# Patient Record
Sex: Male | Born: 1995 | Race: White | Hispanic: No | Marital: Single | Smoking: Former smoker
Health system: Southern US, Community
[De-identification: ages and names within clinical notes are randomized; demographics above are authoritative.]

## PROBLEM LIST (undated history)

## (undated) DIAGNOSIS — L309 Dermatitis, unspecified: Secondary | ICD-10-CM

## (undated) DIAGNOSIS — S02609A Fracture of mandible, unspecified, initial encounter for closed fracture: Secondary | ICD-10-CM

## (undated) DIAGNOSIS — J45909 Unspecified asthma, uncomplicated: Secondary | ICD-10-CM

## (undated) DIAGNOSIS — M2652 Limited mandibular range of motion: Secondary | ICD-10-CM

## (undated) DIAGNOSIS — Z8709 Personal history of other diseases of the respiratory system: Secondary | ICD-10-CM

---
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
## 2015-01-12 ENCOUNTER — Emergency Department (HOSPITAL_COMMUNITY): Payer: 59

## 2015-01-12 ENCOUNTER — Encounter (HOSPITAL_COMMUNITY): Payer: Self-pay | Admitting: Emergency Medicine

## 2015-01-12 ENCOUNTER — Emergency Department (HOSPITAL_COMMUNITY)
Admission: EM | Admit: 2015-01-12 | Discharge: 2015-01-12 | Disposition: A | Discharge status: Home / Self Care | Payer: 59 | Attending: Emergency Medicine | Admitting: Emergency Medicine

## 2015-01-12 DIAGNOSIS — Z792 Long term (current) use of antibiotics: Secondary | ICD-10-CM | POA: Insufficient documentation

## 2015-01-12 DIAGNOSIS — R202 Paresthesia of skin: Secondary | ICD-10-CM | POA: Insufficient documentation

## 2015-01-12 DIAGNOSIS — Z8781 Personal history of (healed) traumatic fracture: Secondary | ICD-10-CM | POA: Insufficient documentation

## 2015-01-12 DIAGNOSIS — Z880 Allergy status to penicillin: Secondary | ICD-10-CM | POA: Insufficient documentation

## 2015-01-12 DIAGNOSIS — Z791 Long term (current) use of non-steroidal anti-inflammatories (NSAID): Secondary | ICD-10-CM | POA: Insufficient documentation

## 2015-01-12 DIAGNOSIS — R200 Anesthesia of skin: Secondary | ICD-10-CM

## 2015-01-12 DIAGNOSIS — J45909 Unspecified asthma, uncomplicated: Secondary | ICD-10-CM | POA: Insufficient documentation

## 2015-01-12 DIAGNOSIS — Z720 Tobacco use: Secondary | ICD-10-CM | POA: Insufficient documentation

## 2015-01-12 DIAGNOSIS — Z872 Personal history of diseases of the skin and subcutaneous tissue: Secondary | ICD-10-CM | POA: Insufficient documentation

## 2015-01-12 LAB — BASIC METABOLIC PANEL
Anion gap: 5 (ref 5–15)
BUN: 10 mg/dL (ref 6–23)
CALCIUM: 8.5 mg/dL (ref 8.4–10.5)
CO2: 29 mmol/L (ref 19–32)
CREATININE: 0.87 mg/dL (ref 0.50–1.35)
Chloride: 105 mmol/L (ref 96–112)
GFR calc Af Amer: >90 mL/min (ref >90)
GFR calc non Af Amer: >90 mL/min (ref >90)
GLUCOSE: 95 mg/dL (ref 70–99)
Potassium: 4.1 mmol/L (ref 3.5–5.1)
SODIUM: 139 mmol/L (ref 135–145)

## 2015-01-12 NOTE — ED Notes (Signed)
Pt alert & oriented x4, stable gait. Patient given discharge instructions, paperwork & prescription(s). Patient  instructed to stop at the registration desk to finish any additional paperwork. Patient verbalized understanding. Pt left department w/ no further questions. 

## 2015-01-12 NOTE — ED Notes (Signed)
Pt c/o left sided face numbness, left arm numbness and left leg numbness. Pt states this has been going on for a while.

## 2015-01-12 NOTE — Discharge Instructions (Signed)

## 2015-01-12 NOTE — ED Provider Notes (Signed)
CSN: [id number]     Arrival date & time 01/12/15  0119 History   First MD Initiated Contact with Patient 01/12/15 0145     Chief Complaint  Patient presents with  . Numbness     (Consider location/radiation/quality/duration/timing/severity/associated sxs/prior Treatment) HPI  Patient presents with left facial, left arm, and left leg numbness. Patient reports he has had left arm and leg numbness intermittently over the last several months. He reports he was evaluated previously in our ER for this and "told nothing was wrong." Patient has to since developed left facial numbness that has been more persistent. He is concerned and "wants to know what's going on." Denies any weakness, vision changes, headaches. Nothing seems to the symptoms better or worse.  Past Medical History  Diagnosis Date  . Eczema     back, legs  . History of asthma     as a child  . Mandible fracture 08/31/2013  . Limited jaw range of motion since 08/31/2013    due to mandible fracture  . Asthma    Past Surgical History  Procedure Laterality Date  . Orif mandibular fracture N/A 08/31/2013    Procedure: OPEN REDUCTION INTERNAL FIXATION (ORIF) MANDIBULAR FRACTURE MMF SCREWS ;  Surgeon: Darletta Moll, MD;  Location: [facility] OR;  Service: ENT;  Laterality: N/A;  . Mandibular hardware removal N/A 10/06/2013    Procedure: MMF REMOVAL;  Surgeon: Darletta Moll, MD;  Location: [facility];  Service: ENT;  Laterality: N/A;   Family History  Problem Relation Age of Onset  . Hypertension Mother    History  Substance Use Topics  . Smoking status: Current Some Day Smoker    Types: Cigarettes    Last Attempt to Quit: 06/22/2014  . Smokeless tobacco: Current User    Types: Snuff     Comment: inside smokers at home  . Alcohol Use: Yes    Review of Systems  Constitutional: Negative.  Negative for fever.  Respiratory: Negative.  Negative for chest tightness and shortness of breath.   Cardiovascular: Negative.   Negative for chest pain.  Gastrointestinal: Negative.  Negative for abdominal pain.  Genitourinary: Negative.  Negative for dysuria.  Musculoskeletal: Negative for back pain.  Skin: Negative for rash.  Neurological: Positive for numbness. Negative for tremors, weakness and headaches.  All other systems reviewed and are negative.     Allergies  Bactrim; Hydrocodone; and Penicillins  Home Medications   Prior to Admission medications   Medication Sig Start Date End Date Taking? Authorizing Provider  azithromycin (ZITHROMAX) 250 MG tablet Take 250 mg by mouth See admin instructions. Take first 2 tablets together, then 1 every day until finished. Completed course of medication today (05-20-14) 05/14/14   Tammy Triplett, PA-C  azithromycin (ZITHROMAX) 250 MG tablet Take 1 tablet (250 mg total) by mouth daily. Take first 2 tablets together, then 1 every day until finished. 06/25/14   Burgess Amor, PA-C  cetirizine-pseudoephedrine (ZYRTEC-D) 5-120 MG per tablet Take 1 tablet by mouth 2 (two) times daily. 06/25/14   Burgess Amor, PA-C  ibuprofen (ADVIL,MOTRIN) 600 MG tablet Take 1 tablet (600 mg total) by mouth every 6 (six) hours as needed. 06/25/14   Burgess Amor, PA-C  ibuprofen (ADVIL,MOTRIN) 800 MG tablet Take 1 tablet (800 mg total) by mouth 3 (three) times daily. 05/21/14   Tomasita Crumble, MD   BP 127/68 mmHg  Pulse 78  Temp(Src) 98 F (36.7 C) (Oral)  Resp 18  Ht 5' 11.5" (1.816 m)  Wt 182 lb (82.555 kg)  BMI 25.03 kg/m2  SpO2 99% Physical Exam  Constitutional: He is oriented to person, place, and time. He appears well-developed and well-nourished.  HENT:  Head: Normocephalic and atraumatic.  Eyes: EOM are normal. Pupils are equal, round, and reactive to light.  Cardiovascular: Normal rate, regular rhythm and normal heart sounds.   No murmur heard. Pulmonary/Chest: Effort normal and breath sounds normal. No respiratory distress. He has no wheezes.  Abdominal: Soft. Bowel sounds are normal.  There is no tenderness. There is no rebound.  Musculoskeletal: He exhibits no edema.  Neurological: He is alert and oriented to person, place, and time.  Cranial nerves II through XII intact, no dysmetria to finger-nose-finger, 5 out of 5 strength in all 4 extremities, subjective asymmetry to light touch to bilateral hands and each side of the face  Skin: Skin is warm and dry.  Psychiatric: He has a normal mood and affect.  Nursing note and vitals reviewed.   ED Course  Procedures (including critical care time) Labs Review Labs Reviewed  BASIC METABOLIC PANEL    Imaging Review Ct Head Wo Contrast  01/12/2015   CLINICAL DATA:  Facial numbness.  Left arm and leg numbness.  EXAM: CT HEAD WITHOUT CONTRAST  TECHNIQUE: Contiguous axial images were obtained from the base of the skull through the vertex without intravenous contrast.  COMPARISON:  03/04/2014  FINDINGS: Maintained gray-white differentiation. No CT evidence of an acute infarction. No intraparenchymal hemorrhage, mass, mass effect, or abnormal extra-axial fluid collection. The ventricles, cisterns, sulci are normal in size, shape, and position. The visualized paranasal sinuses and mastoid air cells are predominantly clear.  IMPRESSION: Normal noncontrast head CT.   Electronically Signed   By: Jearld LeschAndrew  DelGaizo M.D.   On: 01/12/2015 06:03     EKG Interpretation None      MDM   Final diagnoses:  Numbness   Patient presents with numbness and paresthesias. This is been ongoing and intermittent that recently developed facial numbness. Nontoxic on exam. No objective focal abnormalities. Basic labwork obtained to evaluate for electrolyte disturbance. Given patient's concern, CT scan of the head was obtained to evaluate for intracranial abnormality. CT negative. Basic labwork reassuring. Discussed with patient neurology follow-up for further evaluation if symptoms persist.  After history, exam, and medical workup I feel the patient has  been appropriately medically screened and is safe for discharge home. Pertinent diagnoses were discussed with the patient. Patient was given return precautions.   Shon Batonourtney F Horton, MD 01/13/15 [phone]

## 2015-01-12 NOTE — ED Notes (Signed)
Pt states he has had left arm & leg numbness for months. Pt says the face numbness started on Sunday.

## 2015-04-12 ENCOUNTER — Emergency Department (HOSPITAL_COMMUNITY)
Admission: EM | Admit: 2015-04-12 | Discharge: 2015-04-12 | Disposition: A | Discharge status: Home / Self Care | Payer: 59 | Attending: Emergency Medicine | Admitting: Emergency Medicine

## 2015-04-12 ENCOUNTER — Encounter (HOSPITAL_COMMUNITY): Payer: Self-pay | Admitting: Emergency Medicine

## 2015-04-12 DIAGNOSIS — J45909 Unspecified asthma, uncomplicated: Secondary | ICD-10-CM | POA: Insufficient documentation

## 2015-04-12 DIAGNOSIS — R509 Fever, unspecified: Secondary | ICD-10-CM | POA: Insufficient documentation

## 2015-04-12 DIAGNOSIS — Z880 Allergy status to penicillin: Secondary | ICD-10-CM | POA: Insufficient documentation

## 2015-04-12 DIAGNOSIS — Z720 Tobacco use: Secondary | ICD-10-CM | POA: Insufficient documentation

## 2015-04-12 DIAGNOSIS — Z872 Personal history of diseases of the skin and subcutaneous tissue: Secondary | ICD-10-CM | POA: Insufficient documentation

## 2015-04-12 DIAGNOSIS — R112 Nausea with vomiting, unspecified: Secondary | ICD-10-CM

## 2015-04-12 DIAGNOSIS — R197 Diarrhea, unspecified: Secondary | ICD-10-CM | POA: Insufficient documentation

## 2015-04-12 DIAGNOSIS — Z8739 Personal history of other diseases of the musculoskeletal system and connective tissue: Secondary | ICD-10-CM | POA: Insufficient documentation

## 2015-04-12 DIAGNOSIS — Z8781 Personal history of (healed) traumatic fracture: Secondary | ICD-10-CM | POA: Insufficient documentation

## 2015-04-12 MED ORDER — ONDANSETRON HCL 4 MG PO TABS: 4.0000 mg | ORAL_TABLET | Freq: Four times a day (QID) | ORAL | Status: DC

## 2015-04-12 MED ORDER — ONDANSETRON 8 MG PO TBDP
8.0000 mg | ORAL_TABLET | Freq: Once | ORAL | Status: AC
  Administered 2015-04-12: 8 mg via ORAL
  Filled 2015-04-12: qty 1

## 2015-04-12 MED ORDER — DIPHENOXYLATE-ATROPINE 2.5-0.025 MG PO TABS: 2.0000 | ORAL_TABLET | Freq: Four times a day (QID) | ORAL | Status: DC | PRN

## 2015-04-12 NOTE — Discharge Instructions (Signed)

## 2015-04-12 NOTE — ED Provider Notes (Signed)
CSN: [id number]     Arrival date & time 04/12/15  2116 History  This chart was scribed for Gilda Crease, MD by Phillis Haggis, ED Scribe. This patient was seen in room APA04/APA04 and patient care was started at 9:52 PM.     Chief Complaint  Patient presents with  . Fever  . Emesis   Patient is a 19 y.o. male presenting with vomiting. The history is provided by the patient. No language interpreter was used.  Emesis   HPI Comments: Randy [name] is a 19 y.o. male who presents to the Emergency Department complaining of fever, nausea, emesis, and diarrhea onset one day ago. States that Randy [name] is now able to hold down food and drinking fluids. Pt denies cough, sore throat, abdominal pain, or headache.   Past Medical History  Diagnosis Date  . Eczema     back, legs  . History of asthma     as a child  . Mandible fracture 08/31/2013  . Limited jaw range of motion since 08/31/2013    due to mandible fracture  . Asthma    Past Surgical History  Procedure Laterality Date  . Orif mandibular fracture N/A 08/31/2013    Procedure: OPEN REDUCTION INTERNAL FIXATION (ORIF) MANDIBULAR FRACTURE MMF SCREWS ;  Surgeon: Darletta Moll, MD;  Location: [facility] OR;  Service: ENT;  Laterality: N/A;  . Mandibular hardware removal N/A 10/06/2013    Procedure: MMF REMOVAL;  Surgeon: Darletta Moll, MD;  Location: [facility];  Service: ENT;  Laterality: N/A;   Family History  Problem Relation Age of Onset  . Hypertension Mother    History  Substance Use Topics  . Smoking status: Current Some Day Smoker    Types: Cigarettes    Last Attempt to Quit: 06/22/2014  . Smokeless tobacco: Current User    Types: Snuff     Comment: inside smokers at home  . Alcohol Use: Yes     Comment: Occassionally    Review of Systems  Gastrointestinal: Positive for vomiting.      Allergies  Bactrim; Hydrocodone; and Penicillins  Home Medications   Prior to Admission medications   Not on File   BP  150/69 mmHg  Pulse 72  Temp(Src) 99 F (37.2 C) (Oral)  Resp 18  Ht 5' 11.5" (1.816 m)  Wt 183 lb (83.008 kg)  BMI 25.17 kg/m2  SpO2 99%  Physical Exam  Constitutional: Randy [name] is oriented to person, place, and time. Randy [name] appears well-developed and well-nourished. No distress.  HENT:  Head: Normocephalic and atraumatic.  Right Ear: Hearing normal.  Left Ear: Hearing normal.  Nose: Nose normal.  Mouth/Throat: Oropharynx is clear and moist and mucous membranes are normal.  Eyes: Conjunctivae and EOM are normal. Pupils are equal, round, and reactive to light.  Neck: Normal range of motion. Neck supple.  Cardiovascular: Regular rhythm, S1 normal and S2 normal.  Exam reveals no gallop and no friction rub.   No murmur heard. Pulmonary/Chest: Effort normal and breath sounds normal. No respiratory distress. Randy [name] exhibits no tenderness.  Abdominal: Soft. Normal appearance and bowel sounds are normal. There is no hepatosplenomegaly. There is no tenderness. There is no rebound, no guarding, no tenderness at McBurney's point and negative Murphy's sign. No hernia.  Musculoskeletal: Normal range of motion.  Neurological: Randy [name] is alert and oriented to person, place, and time. Randy [name] has normal strength. No cranial nerve deficit or sensory deficit. Coordination normal. GCS eye subscore is 4. GCS  verbal subscore is 5. GCS motor subscore is 6.  Skin: Skin is warm, dry and intact. No rash noted. No cyanosis.  Psychiatric: Randy [name] has a normal mood and affect. His speech is normal and behavior is normal. Thought content normal.  Nursing note and vitals reviewed.   ED Course  Procedures (including critical care time) DIAGNOSTIC STUDIES: Oxygen Saturation is 99% on RA, normal by my interpretation.    COORDINATION OF CARE: 9:54 PM-Discussed treatment plan which includes work note, fluids and rest with pt at bedside and pt agreed to plan.     Labs Review Labs Reviewed  LIPASE, BLOOD  COMPREHENSIVE METABOLIC PANEL   CBC  URINALYSIS, ROUTINE W REFLEX MICROSCOPIC (NOT AT [facility])    Imaging Review No results found.   EKG Interpretation None      MDM   Final diagnoses:  None   vomiting  Diarrhea  Viral syndrome  Patient appears well. Randy [name] has symptoms of fever, nausea, vomiting, diarrhea with abdominal pain through the day. Randy [name] reports that his symptoms are improved now. [name] difficulty. Abdominal exam is entirely benign and nontender. Randy [name] has not experienced any pain associated with the symptoms. Combination of vomiting and diarrhea with low-grade fever consistent with a viral process. No concern for intra-abdominal process such as appendicitis, cholecystitis, etc. Treat symptomatically. Oral hydration at home.  I personally performed the services described in this documentation, which was scribed in my presence. The recorded information has been reviewed and is accurate.       Gilda Creasehristopher J Adisa Vigeant, MD 04/12/15 2203

## 2015-04-12 NOTE — ED Notes (Signed)
Pt states he started having N/V/D yesterday around lunch time

## 2015-05-17 ENCOUNTER — Emergency Department (HOSPITAL_COMMUNITY): Payer: 59

## 2015-05-17 ENCOUNTER — Encounter (HOSPITAL_COMMUNITY): Payer: Self-pay | Admitting: *Deleted

## 2015-05-17 ENCOUNTER — Emergency Department (HOSPITAL_COMMUNITY)
Admission: EM | Admit: 2015-05-17 | Discharge: 2015-05-17 | Disposition: A | Discharge status: Home / Self Care | Payer: 59 | Attending: Emergency Medicine | Admitting: Emergency Medicine

## 2015-05-17 DIAGNOSIS — Z8781 Personal history of (healed) traumatic fracture: Secondary | ICD-10-CM | POA: Insufficient documentation

## 2015-05-17 DIAGNOSIS — X58XXXA Exposure to other specified factors, initial encounter: Secondary | ICD-10-CM | POA: Insufficient documentation

## 2015-05-17 DIAGNOSIS — Y9389 Activity, other specified: Secondary | ICD-10-CM | POA: Insufficient documentation

## 2015-05-17 DIAGNOSIS — Y990 Civilian activity done for income or pay: Secondary | ICD-10-CM | POA: Insufficient documentation

## 2015-05-17 DIAGNOSIS — Z8739 Personal history of other diseases of the musculoskeletal system and connective tissue: Secondary | ICD-10-CM | POA: Insufficient documentation

## 2015-05-17 DIAGNOSIS — Z720 Tobacco use: Secondary | ICD-10-CM | POA: Insufficient documentation

## 2015-05-17 DIAGNOSIS — S39012A Strain of muscle, fascia and tendon of lower back, initial encounter: Secondary | ICD-10-CM | POA: Insufficient documentation

## 2015-05-17 DIAGNOSIS — Y9289 Other specified places as the place of occurrence of the external cause: Secondary | ICD-10-CM | POA: Insufficient documentation

## 2015-05-17 DIAGNOSIS — Z880 Allergy status to penicillin: Secondary | ICD-10-CM | POA: Insufficient documentation

## 2015-05-17 DIAGNOSIS — Z872 Personal history of diseases of the skin and subcutaneous tissue: Secondary | ICD-10-CM | POA: Insufficient documentation

## 2015-05-17 DIAGNOSIS — J45909 Unspecified asthma, uncomplicated: Secondary | ICD-10-CM | POA: Insufficient documentation

## 2015-05-17 MED ORDER — CYCLOBENZAPRINE HCL 10 MG PO TABS
10.0000 mg | ORAL_TABLET | Freq: Once | ORAL | Status: AC
  Administered 2015-05-17: 10 mg via ORAL
  Filled 2015-05-17: qty 1

## 2015-05-17 MED ORDER — NAPROXEN 250 MG PO TABS
500.0000 mg | ORAL_TABLET | Freq: Once | ORAL | Status: AC
  Administered 2015-05-17: 500 mg via ORAL
  Filled 2015-05-17: qty 2

## 2015-05-17 MED ORDER — CYCLOBENZAPRINE HCL 5 MG PO TABS: 5.0000 mg | ORAL_TABLET | Freq: Three times a day (TID) | ORAL | Status: DC | PRN

## 2015-05-17 MED ORDER — IBUPROFEN 600 MG PO TABS: 600.0000 mg | ORAL_TABLET | Freq: Four times a day (QID) | ORAL | Status: DC | PRN

## 2015-05-17 NOTE — Discharge Instructions (Signed)

## 2015-05-17 NOTE — ED Notes (Signed)
Pt c/o lower back pain after an injury at work today; pt states he was lifting heavy equipment when he was injured

## 2015-05-19 NOTE — ED Provider Notes (Signed)
CSN: [id number]     Arrival date & time 05/17/15  2044 History   First MD Initiated Contact with Patient 05/17/15 2102     Chief Complaint  Patient presents with  . Back Pain     (Consider location/radiation/quality/duration/timing/severity/associated sxs/prior Treatment) Patient is a 19 y.o. male presenting with back pain. The history is provided by the patient.  Back Pain Location:  Lumbar spine Quality:  Cramping and aching Radiates to:  Does not radiate Pain severity:  Moderate Pain is:  Same all the time Onset quality:  Sudden Duration:  8 hours Timing:  Constant Progression:  Unchanged Chronicity:  New Context: lifting heavy objects   Context comment:  Sx started when lifting a heavy piece of equipment at work today Relieved by:  None tried Worsened by:  Movement and twisting Ineffective treatments:  None tried Associated symptoms: no abdominal pain, no bladder incontinence, no bowel incontinence, no chest pain, no dysuria, no fever, no leg pain, no numbness, no paresthesias, no perianal numbness, no tingling and no weakness     Past Medical History  Diagnosis Date  . Eczema     back, legs  . History of asthma     as a child  . Mandible fracture 08/31/2013  . Limited jaw range of motion since 08/31/2013    due to mandible fracture  . Asthma    Past Surgical History  Procedure Laterality Date  . Orif mandibular fracture N/A 08/31/2013    Procedure: OPEN REDUCTION INTERNAL FIXATION (ORIF) MANDIBULAR FRACTURE MMF SCREWS ;  Surgeon: Darletta Moll, MD;  Location: [facility] OR;  Service: ENT;  Laterality: N/A;  . Mandibular hardware removal N/A 10/06/2013    Procedure: MMF REMOVAL;  Surgeon: Darletta Moll, MD;  Location: [facility];  Service: ENT;  Laterality: N/A;   Family History  Problem Relation Age of Onset  . Hypertension Mother    Social History  Substance Use Topics  . Smoking status: Current Some Day Smoker    Types: Cigarettes    Last Attempt to Quit:  06/22/2014  . Smokeless tobacco: Current User    Types: Snuff     Comment: inside smokers at home  . Alcohol Use: Yes     Comment: Occassionally    Review of Systems  Constitutional: Negative for fever.  Respiratory: Negative for shortness of breath.   Cardiovascular: Negative for chest pain and leg swelling.  Gastrointestinal: Negative for abdominal pain, constipation, abdominal distention and bowel incontinence.  Genitourinary: Negative for bladder incontinence, dysuria, urgency, frequency, flank pain and difficulty urinating.  Musculoskeletal: Positive for back pain. Negative for joint swelling and gait problem.  Skin: Negative for rash.  Neurological: Negative for tingling, weakness, numbness and paresthesias.      Allergies  Bactrim; Hydrocodone; and Penicillins  Home Medications   Prior to Admission medications   Medication Sig Start Date End Date Taking? Authorizing Provider  cyclobenzaprine (FLEXERIL) 5 MG tablet Take 1 tablet (5 mg total) by mouth 3 (three) times daily as needed. 05/17/15   Burgess Amor, PA-C  diphenoxylate-atropine (LOMOTIL) 2.5-0.025 MG per tablet Take 2 tablets by mouth 4 (four) times daily as needed for diarrhea or loose stools. Patient not taking: Reported on 05/17/2015 04/12/15   Gilda Crease, MD  ibuprofen (ADVIL,MOTRIN) 600 MG tablet Take 1 tablet (600 mg total) by mouth every 6 (six) hours as needed. 05/17/15   Burgess Amor, PA-C  ondansetron (ZOFRAN) 4 MG tablet Take 1 tablet (4 mg total)  by mouth every 6 (six) hours. Patient not taking: Reported on 05/17/2015 04/12/15   Gilda Crease, MD   BP 134/64 mmHg  Pulse 73  Temp(Src) 98.3 F (36.8 C) (Oral)  Resp 24  Ht 5' 11.5" (1.816 m)  Wt 190 lb 6.4 oz (86.365 kg)  BMI 26.19 kg/m2  SpO2 100% Physical Exam  Constitutional: He appears well-developed and well-nourished.  HENT:  Head: Normocephalic.  Eyes: Conjunctivae are normal.  Neck: Normal range of motion. Neck supple.   Cardiovascular: Normal rate and intact distal pulses.   Pedal pulses normal.  Pulmonary/Chest: Effort normal.  Abdominal: Soft. Bowel sounds are normal. He exhibits no distension and no mass.  Musculoskeletal: Normal range of motion. He exhibits no edema.       Lumbar back: He exhibits tenderness. He exhibits no swelling, no edema and no spasm.  Neurological: He is alert. He has normal strength. He displays no atrophy and no tremor. No sensory deficit. Gait normal.  Reflex Scores:      Patellar reflexes are 2+ on the right side and 2+ on the left side.      Achilles reflexes are 2+ on the right side and 2+ on the left side. No strength deficit noted in hip and knee flexor and extensor muscle groups.  Ankle flexion and extension intact.  Skin: Skin is warm and dry.  Psychiatric: He has a normal mood and affect.  Nursing note and vitals reviewed.   ED Course  Procedures (including critical care time)  Imaging Review Dg Lumbar Spine Complete  05/17/2015   CLINICAL DATA:  Low back pain after work injury today. Lifting heavy equipment.  EXAM: LUMBAR SPINE - COMPLETE 4+ VIEW  COMPARISON:  CT abdomen and pelvis 06/19/2011  FINDINGS: There is no evidence of lumbar spine fracture. Alignment is normal. Intervertebral disc spaces are maintained.  IMPRESSION: Negative.   Electronically Signed   By: Burman Nieves M.D.   On: 05/17/2015 21:46     MDM   Final diagnoses:  Low back strain, initial encounter      Radiological studies were viewed, interpreted and considered during the medical decision making and disposition process. I agree with radiologists reading.  Results were also discussed with patient.    No neuro deficit on exam or by history to suggest emergent or surgical presentation.  Also discussed worsened sx that should prompt immediate re-evaluation including distal weakness, bowel/bladder retention/incontinence.  Pt prescribed ibuprofen, flexeril. Advised ice tx x 2 days, adding  heat on day 3. F/u with pcp if sx persist or worsen.  Activity as tolerated.      Burgess Amor, PA-C 05/19/15 1037  Raeford Razor, MD 05/19/15 [id number]

## 2015-11-03 ENCOUNTER — Emergency Department (HOSPITAL_COMMUNITY)
Admission: EM | Admit: 2015-11-03 | Discharge: 2015-11-03 | Disposition: A | Discharge status: Home / Self Care | Payer: 59 | Attending: Emergency Medicine | Admitting: Emergency Medicine

## 2015-11-03 ENCOUNTER — Encounter (HOSPITAL_COMMUNITY): Payer: Self-pay | Admitting: Emergency Medicine

## 2015-11-03 DIAGNOSIS — Z872 Personal history of diseases of the skin and subcutaneous tissue: Secondary | ICD-10-CM | POA: Diagnosis not present

## 2015-11-03 DIAGNOSIS — J45909 Unspecified asthma, uncomplicated: Secondary | ICD-10-CM | POA: Insufficient documentation

## 2015-11-03 DIAGNOSIS — F17210 Nicotine dependence, cigarettes, uncomplicated: Secondary | ICD-10-CM | POA: Diagnosis not present

## 2015-11-03 DIAGNOSIS — M791 Myalgia: Secondary | ICD-10-CM | POA: Diagnosis not present

## 2015-11-03 DIAGNOSIS — Z8781 Personal history of (healed) traumatic fracture: Secondary | ICD-10-CM | POA: Diagnosis not present

## 2015-11-03 DIAGNOSIS — Z880 Allergy status to penicillin: Secondary | ICD-10-CM | POA: Diagnosis not present

## 2015-11-03 DIAGNOSIS — M7918 Myalgia, other site: Secondary | ICD-10-CM

## 2015-11-03 DIAGNOSIS — R109 Unspecified abdominal pain: Secondary | ICD-10-CM | POA: Diagnosis present

## 2015-11-03 LAB — URINALYSIS, ROUTINE W REFLEX MICROSCOPIC
Bilirubin Urine: NEGATIVE
Glucose, UA: NEGATIVE mg/dL
Hgb urine dipstick: NEGATIVE
KETONES UR: NEGATIVE mg/dL
Leukocytes, UA: NEGATIVE
NITRITE: NEGATIVE
Protein, ur: NEGATIVE mg/dL
Specific Gravity, Urine: 1.02 (ref 1.005–1.030)
pH: 7 (ref 5.0–8.0)

## 2015-11-03 MED ORDER — NAPROXEN 500 MG PO TABS: 500.0000 mg | ORAL_TABLET | Freq: Two times a day (BID) | ORAL | Status: DC

## 2015-11-03 NOTE — ED Notes (Signed)
Pt c/o abd pain that shoot to the groin x 1 week.

## 2015-11-03 NOTE — ED Provider Notes (Signed)
CSN: [id number]     Arrival date & time 11/03/15  2027 History  By signing my name below, I, Linus Galas, attest that this documentation has been prepared under the direction and in the presence of Shon Baton, MD. Electronically Signed: Linus Galas, ED Scribe. 11/03/2015. 11:23 PM.   Chief Complaint  Patient presents with  . Abdominal Pain   HPI HPI Comments: DONDRE CATALFAMO is a 20 y.o. male who presents to the Emergency Department complaining of dull abdominal pain that radiates to the groin for the past 1 week. Pt is in no pain currently and rates it a 0/10. Pt reports that he feels his abdomen is distended for the past few days. He states his pain is worse with lifting. Pt denies any masses or hernia. Pt denies any fevers, nausea, vomiting, testicular pain or swelling or any other sx. Pt denies taking any medications daily. Pt denies any know drug allergies.   Past Medical History  Diagnosis Date  . Eczema     back, legs  . History of asthma     as a child  . Mandible fracture (H[facility]) 08/31/2013  . Limited jaw range of motion since 08/31/2013    due to mandible fracture  . Asthma    Past Surgical History  Procedure Laterality Date  . Orif mandibular fracture N/A 08/31/2013    Procedure: OPEN REDUCTION INTERNAL FIXATION (ORIF) MANDIBULAR FRACTURE MMF SCREWS ;  Surgeon: Darletta Moll, MD;  Location: [facility] OR;  Service: ENT;  Laterality: N/A;  . Mandibular hardware removal N/A 10/06/2013    Procedure: MMF REMOVAL;  Surgeon: Darletta Moll, MD;  Location: [facility];  Service: ENT;  Laterality: N/A;   Family History  Problem Relation Age of Onset  . Hypertension Mother    Social History  Substance Use Topics  . Smoking status: Current Some Day Smoker    Types: Cigarettes    Last Attempt to Quit: 06/22/2014  . Smokeless tobacco: Current User    Types: Snuff     Comment: inside smokers at home  . Alcohol Use: Yes     Comment: Occassionally    Review of Systems   Constitutional: Negative for fever.  Gastrointestinal: Positive for abdominal pain. Negative for nausea and vomiting.  Genitourinary: Negative for dysuria, hematuria, scrotal swelling and testicular pain.  All other systems reviewed and are negative.     Allergies  Bactrim; Hydrocodone; and Penicillins  Home Medications   Prior to Admission medications   Medication Sig Start Date End Date Taking? Authorizing Provider  cyclobenzaprine (FLEXERIL) 5 MG tablet Take 1 tablet (5 mg total) by mouth 3 (three) times daily as needed. 05/17/15   Burgess Amor, PA-C  diphenoxylate-atropine (LOMOTIL) 2.5-0.025 MG per tablet Take 2 tablets by mouth 4 (four) times daily as needed for diarrhea or loose stools. Patient not taking: Reported on 05/17/2015 04/12/15   Gilda Crease, MD  ibuprofen (ADVIL,MOTRIN) 600 MG tablet Take 1 tablet (600 mg total) by mouth every 6 (six) hours as needed. 05/17/15   Burgess Amor, PA-C  naproxen (NAPROSYN) 500 MG tablet Take 1 tablet (500 mg total) by mouth 2 (two) times daily. 11/03/15   Shon Baton, MD  ondansetron (ZOFRAN) 4 MG tablet Take 1 tablet (4 mg total) by mouth every 6 (six) hours. Patient not taking: Reported on 05/17/2015 04/12/15   Gilda Crease, MD   BP 117/74 mmHg  Pulse 64  Temp(Src) 98.5 F (36.9 C) (Oral)  Resp 18  Ht  (1.803 m)  Wt 190 lb (86.183 kg)  BMI 26.51 kg/m2  SpO2 99% Physical Exam  Constitutional: He is oriented to person, place, and time. He appears well-developed and well-nourished.  HENT:  Head: Normocephalic and atraumatic.  Eyes: Pupils are equal, round, and reactive to light.  Neck: Neck supple.  Cardiovascular: Normal rate, regular rhythm and normal heart sounds.   No murmur heard. Pulmonary/Chest: Effort normal and breath sounds normal. No respiratory distress. He has no wheezes.  Abdominal: Soft. Bowel sounds are normal. He exhibits no mass. There is no tenderness. There is no rebound and no guarding.   No inguinal mass palpated  Genitourinary:  No scrotal masses  Musculoskeletal: He exhibits no edema.  Lymphadenopathy:    He has no cervical adenopathy.  Neurological: He is alert and oriented to person, place, and time.  Skin: Skin is warm and dry.  Psychiatric: He has a normal mood and affect.  Nursing note and vitals reviewed.   ED Course  Procedures  DIAGNOSTIC STUDIES: Oxygen Saturation is 99% on room air, normal by my interpretation.    COORDINATION OF CARE: 11:04 PM Discussed treatment plan with pt at bedside and pt agreed to plan.Return precautions reviewed  Labs Review Labs Reviewed  URINALYSIS, ROUTINE W REFLEX MICROSCOPIC (NOT AT [facility]) - Abnormal; Notable for the following:    APPearance HAZY (*)    All other components within normal limits    MDM   Final diagnoses:  Musculoskeletal pain    Patient presents with abdominal pain. It began when he picks something up heavy at work and is present with certain movements and heavy lifting. He has not noted any masses. His exam is completely normal today. He is without pain. Discussed with patient's possibilities including musculoskeletal strain versus hernia. Will start patient on a trial of NSAIDs and have him observe for any masses. Also encouraged safe lifting practices and potentially a lifting belt.  Patient stated understanding. He was given strict return precautions.  After history, exam, and medical workup I feel the patient has been appropriately medically screened and is safe for discharge home. Pertinent diagnoses were discussed with the patient. Patient was given return precautions.  I personally performed the services described in this documentation, which was scribed in my presence. The recorded information has been reviewed and is accurate.    Shon Baton, MD 11/03/15 2328

## 2015-11-03 NOTE — Discharge Instructions (Signed)
You were seen today for abdominal pain. It appears to be musculoskeletal in nature. Other potential causes would be a hernia. There is no obvious hernia on your physical exam. If you notice a bulging in her groin area or mass in your scrotum, you should be reevaluated.  See return precautions below.  Musculoskeletal Pain Musculoskeletal pain is muscle and boney aches and pains. These pains can occur in any part of the body. Your caregiver may treat you without knowing the cause of the pain. They may treat you if blood or urine tests, X-rays, and other tests were normal.  CAUSES There is often not a definite cause or reason for these pains. These pains may be caused by a type of germ (virus). The discomfort may also come from overuse. Overuse includes working out too hard when your body is not fit. Boney aches also come from weather changes. Bone is sensitive to atmospheric pressure changes. HOME CARE INSTRUCTIONS   Ask when your test results will be ready. Make sure you get your test results.  Only take over-the-counter or prescription medicines for pain, discomfort, or fever as directed by your caregiver. If you were given medications for your condition, do not drive, operate machinery or power tools, or sign legal documents for 24 hours. Do not drink alcohol. Do not take sleeping pills or other medications that may interfere with treatment.  Continue all activities unless the activities cause more pain. When the pain lessens, slowly resume normal activities. Gradually increase the intensity and duration of the activities or exercise.  During periods of severe pain, bed rest may be helpful. Lay or sit in any position that is comfortable.  Putting ice on the injured area.  Put ice in a bag.  Place a towel between your skin and the bag.  Leave the ice on for 15 to 20 minutes, 3 to 4 times a day.  Follow up with your caregiver for continued problems and no reason can be found for the pain. If  the pain becomes worse or does not go away, it may be necessary to repeat tests or do additional testing. Your caregiver may need to look further for a possible cause. SEEK IMMEDIATE MEDICAL CARE IF:  You have pain that is getting worse and is not relieved by medications.  You develop chest pain that is associated with shortness or breath, sweating, feeling sick to your stomach (nauseous), or throw up (vomit).  Your pain becomes localized to the abdomen.  You develop any new symptoms that seem different or that concern you. MAKE SURE YOU:   Understand these instructions.  Will watch your condition.  Will get help right away if you are not doing well or get worse.   This information is not intended to replace advice given to you by your health care provider. Make sure you discuss any questions you have with your health care provider.   Document Released: 09/11/2005 Document Revised: 12/04/2011 Document Reviewed: 05/16/2013 Elsevier Interactive Patient Education 2016 [employer] Hernia, Adult A hernia is the bulging of an organ or tissue through a weak spot in the muscles of the abdomen (abdominal wall). Hernias develop most often near the navel or groin. There are many kinds of hernias. Common kinds include:  Femoral hernia. This kind of hernia develops under the groin in the upper thigh area.  Inguinal hernia. This kind of hernia develops in the groin or scrotum.  Umbilical hernia. This kind of hernia develops near the navel.  Hiatal  hernia. This kind of hernia causes part of the stomach to be pushed up into the chest.  Incisional hernia. This kind of hernia bulges through a scar from an abdominal surgery. CAUSES This condition may be caused by:  Heavy lifting.  Coughing over a long period of time.  Straining to have a bowel movement.  An incision made during an abdominal surgery.  A birth defect (congenital defect).  Excess weight or obesity.  Smoking.  Poor  nutrition.  Cystic fibrosis.  Excess fluid in the abdomen.  Undescended testicles. SYMPTOMS Symptoms of a hernia include:  A lump on the abdomen. This is the first sign of a hernia. The lump may become more obvious with standing, straining, or coughing. It may get bigger over time if it is not treated or if the condition causing it is not treated.  Pain. A hernia is usually painless, but it may become painful over time if treatment is delayed. The pain is usually dull and may get worse with standing or lifting heavy objects. Sometimes a hernia gets tightly squeezed in the weak spot (strangulated) or stuck there (incarcerated) and causes additional symptoms. These symptoms may include:  Vomiting.  Nausea.  Constipation.  Irritability. DIAGNOSIS A hernia may be diagnosed with:  A physical exam. During the exam your health care provider may ask you to cough or to make a specific movement, because a hernia is usually more visible when you move.  Imaging tests. These can include:  X-rays.  Ultrasound.  CT scan. TREATMENT A hernia that is small and painless may not need to be treated. A hernia that is large or painful may be treated with surgery. Inguinal hernias may be treated with surgery to prevent incarceration or strangulation. Strangulated hernias are always treated with surgery, because lack of blood to the trapped organ or tissue can cause it to die. Surgery to treat a hernia involves pushing the bulge back into place and repairing the weak part of the abdomen. HOME CARE INSTRUCTIONS  Avoid straining.  Do not lift anything heavier than 10 lb (4.5 kg).  Lift with your leg muscles, not your back muscles. This helps avoid strain.  When coughing, try to cough gently.  Prevent constipation. Constipation leads to straining with bowel movements, which can make a hernia worse or cause a hernia repair to break down. You can prevent constipation by:  Eating a high-fiber diet  that includes plenty of fruits and vegetables.  Drinking enough fluids to keep your urine clear or pale yellow. Aim to drink 6-8 glasses of water per day.  Using a stool softener as directed by your health care provider.  Lose weight, if you are overweight.  Do not use any tobacco products, including cigarettes, chewing tobacco, or electronic cigarettes. If you need help quitting, ask your health care provider.  Keep all follow-up visits as directed by your health care provider. This is important. Your health care provider may need to monitor your condition. SEEK MEDICAL CARE IF:  You have swelling, redness, and pain in the affected area.  Your bowel habits change. SEEK IMMEDIATE MEDICAL CARE IF:  You have a fever.  You have abdominal pain that is getting worse.  You feel nauseous or you vomit.  You cannot push the hernia back in place by gently pressing on it while you are lying down.  The hernia:  Changes in shape or size.  Is stuck outside the abdomen.  Becomes discolored.  Feels hard or tender.  This information is not intended to replace advice given to you by your health care provider. Make sure you discuss any questions you have with your health care provider.   Document Released: 09/11/2005 Document Revised: 10/02/2014 Document Reviewed: 07/22/2014 Elsevier Interactive Patient Education [employer].

## 2016-01-28 ENCOUNTER — Emergency Department (HOSPITAL_COMMUNITY)
Admission: EM | Admit: 2016-01-28 | Discharge: 2016-01-29 | Disposition: A | Discharge status: Home / Self Care | Payer: 59 | Attending: Emergency Medicine | Admitting: Emergency Medicine

## 2016-01-28 ENCOUNTER — Encounter (HOSPITAL_COMMUNITY): Payer: Self-pay | Admitting: *Deleted

## 2016-01-28 DIAGNOSIS — J45909 Unspecified asthma, uncomplicated: Secondary | ICD-10-CM | POA: Insufficient documentation

## 2016-01-28 DIAGNOSIS — R109 Unspecified abdominal pain: Secondary | ICD-10-CM

## 2016-01-28 DIAGNOSIS — F17210 Nicotine dependence, cigarettes, uncomplicated: Secondary | ICD-10-CM | POA: Insufficient documentation

## 2016-01-28 DIAGNOSIS — R1031 Right lower quadrant pain: Secondary | ICD-10-CM | POA: Insufficient documentation

## 2016-01-28 NOTE — ED Provider Notes (Signed)
TIME SEEN: 11:55 PM  CHIEF COMPLAINT: Abdominal Pain  HPI: Randy [name] is a 20 y.o. male with h/o eczema who presents to the Emergency Department complaining of sudden onset, intermittent, sharp, RLQ pain for several hours. He states that he is in pain currently. Pt believes that he may have "pulled something" at work but does not remember injuring himself at work. Pt endorses abdominal pain exacerbation with movement. He denies recent long travel or sick contacts with similar symptoms. Pt has no h/o abdominal surgery. He has not used antibiotics recently. Pt further denies fever, chills, nausea, vomiting, diarrhea, dysuria, hematuria, testicular pain, penile discharge, or any other associated symptoms.  Has never had similar symptoms.  ROS: See HPI Constitutional: no fever  Eyes: no drainage  ENT: no runny nose   Cardiovascular:  no chest pain  Resp: no SOB  GI: no vomiting GU: no dysuria Integumentary: abdominal rash Allergy: no hives  Musculoskeletal: no leg swelling  Neurological: no slurred speech ROS otherwise negative  PAST MEDICAL HISTORY/PAST SURGICAL HISTORY:  Past Medical History  Diagnosis Date  . Eczema     back, legs  . History of asthma     as a child  . Mandible fracture (HCC) 08/31/2013  . Limited jaw range of motion since 08/31/2013    due to mandible fracture  . Asthma     MEDICATIONS:  Prior to Admission medications   Medication Sig Start Date End Date Taking? Authorizing Provider  diphenoxylate-atropine (LOMOTIL) 2.5-0.025 MG per tablet Take 2 tablets by mouth 4 (four) times daily as needed for diarrhea or loose stools. Patient not taking: Reported on 05/17/2015 04/12/15   Gilda Crease, MD    ALLERGIES:  Allergies  Allergen Reactions  . Bactrim Hives  . Hydrocodone Hives  . Penicillins Hives    SOCIAL HISTORY:  Social History  Substance Use Topics  . Smoking status: Current Some Day Smoker    Types: Cigarettes  . Smokeless  tobacco: Current User    Types: Snuff     Comment: inside smokers at home  . Alcohol Use: Yes     Comment: Occassionally    FAMILY HISTORY: Family History  Problem Relation Age of Onset  . Hypertension Mother     EXAM: BP 164/90 mmHg  Pulse 74  Temp(Src) 99.1 F (37.3 C) (Tympanic)  Resp 18  Ht 5' 11.5" (1.816 m)  Wt 177 lb 2 oz (80.343 kg)  BMI 24.36 kg/m2  SpO2 100% CONSTITUTIONAL: Alert and oriented and responds appropriately to questions. Well-appearing; well-nourished, Afebrile, nontoxic, in no significant distress HEAD: Normocephalic EYES: Conjunctivae clear, PERRL ENT: normal nose; no rhinorrhea; moist mucous membranes NECK: Supple, no meningismus, no LAD  CARD: RRR; S1 and S2 appreciated; no murmurs, no clicks, no rubs, no gallops RESP: Normal chest excursion without splinting or tachypnea; breath sounds clear and equal bilaterally; no wheezes, no rhonchi, no rales, no hypoxia or respiratory distress, speaking full sentences ABD/GI: Normal bowel sounds; non-distended; soft, very minimally tender in the mid and lower right abdomen, no rebound, no guarding, no peritoneal signs BACK:  The back appears normal and is non-tender to palpation, there is no CVA tenderness EXT: Normal ROM in all joints; non-tender to palpation; no edema; normal capillary refill; no cyanosis, no calf tenderness or swelling    SKIN: Normal color for age and race; warm; eczematous rash with no signs of super imposed infection NEURO: Moves all extremities equally, sensation to light touch intact diffusely, cranial nerves II  through XII intact PSYCH: The patient's mood and manner are appropriate. Grooming and personal hygiene are appropriate.  MEDICAL DECISION MAKING: Patient here with abdominal pain that started earlier today. States he thinks that he pulled something at work. No fever in the emergency department. No sign of hernia on exam. No significant tenderness at McBurney's point. Discussed with  him that this could be a pulled muscle but that early appendicitis is also in the differential. I do feel however if we were to obtain a CT scan of his abdomen it would be normal and have discussed with him that that could be the case in early appendicitis. Will obtain labs, urine and closely monitor him with serial abdominal exams. He denies wanting any pain or nausea medicine at this time.  ED PROGRESS: Patient's abdominal exam is still completely benign. Labs, urine unremarkable. No leukocytosis. No fever. Again my suspicion for appendicitis is very low but have advised him to return to the emergency department 24 hours if he is still having right-sided abdominal pain or his symptoms worsen. Discussed with him if he is asymptomatic, I feel he can follow-up as an outpatient as needed. At this time I do not feel emergent CT imaging is appropriate as again I feel that this imaging could be normal even if this was early appendicitis and then we would run the risk of having to discuss the possibility of a repeat CT scan if symptoms worsened. Discussed with him that this would be a great deal of radiation. He agrees with this plan. He is comfortable with this plan. He is a reliable patient. Discussed at length return precautions. He verbalized understanding.    At this time, I do not feel there is any life-threatening condition present. I have reviewed and discussed all results (EKG, imaging, lab, urine as appropriate), exam findings with patient. I have reviewed nursing notes and appropriate previous records.  I feel the patient is safe to be discharged home without further emergent workup. Discussed usual and customary return precautions. Patient and family (if present) verbalize understanding and are comfortable with this plan.  Patient will follow-up with their primary care provider. If they do not have a primary care provider, information for follow-up has been provided to them. All questions have been  answered.   I personally performed the services described in this documentation, which was scribed in my presence. The recorded information has been reviewed and is accurate.      Layla MawKristen N Persia Lintner, DO 01/29/16 0107

## 2016-01-28 NOTE — ED Notes (Signed)
Pt reports intermittent, sharp rlq abdominal pain since earlier today. Pt denies n/v/d. Denies urinary symptoms.

## 2016-01-29 LAB — COMPREHENSIVE METABOLIC PANEL
ALT: 12 U/L — ABNORMAL LOW (ref 17–63)
ANION GAP: 9 (ref 5–15)
AST: 13 U/L — ABNORMAL LOW (ref 15–41)
Albumin: 4.6 g/dL (ref 3.5–5.0)
Alkaline Phosphatase: 40 U/L (ref 38–126)
BILIRUBIN TOTAL: 0.8 mg/dL (ref 0.3–1.2)
BUN: 8 mg/dL (ref 6–20)
CO2: 28 mmol/L (ref 22–32)
Calcium: 9 mg/dL (ref 8.9–10.3)
Chloride: 102 mmol/L (ref 101–111)
Creatinine, Ser: 0.9 mg/dL (ref 0.61–1.24)
GFR calc non Af Amer: >60 mL/min (ref >60)
GLUCOSE: 90 mg/dL (ref 65–99)
POTASSIUM: 3.3 mmol/L — AB (ref 3.5–5.1)
SODIUM: 139 mmol/L (ref 135–145)
TOTAL PROTEIN: 7.5 g/dL (ref 6.5–8.1)

## 2016-01-29 LAB — CBC WITH DIFFERENTIAL/PLATELET
BASOS ABS: 0 10*3/uL (ref 0.0–0.1)
Basophils Relative: 0 %
EOS ABS: 0.1 10*3/uL (ref 0.0–0.7)
Eosinophils Relative: 1 %
HCT: 46.3 % (ref 39.0–52.0)
Hemoglobin: 15.7 g/dL (ref 13.0–17.0)
Lymphocytes Relative: 28 %
Lymphs Abs: 2 10*3/uL (ref 0.7–4.0)
MCH: 29.4 pg (ref 26.0–34.0)
MCHC: 33.9 g/dL (ref 30.0–36.0)
MCV: 86.7 fL (ref 78.0–100.0)
MONOS PCT: 7 %
Monocytes Absolute: 0.5 10*3/uL (ref 0.1–1.0)
Neutro Abs: 4.5 10*3/uL (ref 1.7–7.7)
Neutrophils Relative %: 64 %
PLATELETS: 236 10*3/uL (ref 150–400)
RBC: 5.34 MIL/uL (ref 4.22–5.81)
RDW: 12.6 % (ref 11.5–15.5)
WBC: 7.1 10*3/uL (ref 4.0–10.5)

## 2016-01-29 LAB — URINALYSIS, ROUTINE W REFLEX MICROSCOPIC
Bilirubin Urine: NEGATIVE
GLUCOSE, UA: NEGATIVE mg/dL
Hgb urine dipstick: NEGATIVE
Ketones, ur: NEGATIVE mg/dL
LEUKOCYTES UA: NEGATIVE
Nitrite: NEGATIVE
Protein, ur: NEGATIVE mg/dL
Specific Gravity, Urine: 1.025 (ref 1.005–1.030)
pH: 7 (ref 5.0–8.0)

## 2016-01-29 LAB — LIPASE, BLOOD: LIPASE: 28 U/L (ref 11–51)

## 2016-01-29 NOTE — ED Notes (Signed)
Dr Ward in to assess 

## 2016-01-29 NOTE — Discharge Instructions (Signed)
Please follow-up as emergency department in 24 hours if you are still having right-sided pain. Please come back sooner if your pain worsens, you have fever, vomiting and cannot stop, blood in your stool. Your labs, urine today were normal. My suspicion that this is appendicitis is low but as we discussed, symptoms can worsen and you should be reevaluated in 24 hours unless you are completely asymptomatic.   Abdominal Pain, Adult Many things can cause abdominal pain. Usually, abdominal pain is not caused by a disease and will improve without treatment. It can often be observed and treated at home. Your health care provider will do a physical exam and possibly order blood tests and X-rays to help determine the seriousness of your pain. However, in many cases, more time must pass before a clear cause of the pain can be found. Before that point, your health care provider may not know if you need more testing or further treatment. HOME CARE INSTRUCTIONS Monitor your abdominal pain for any changes. The following actions may help to alleviate any discomfort you are experiencing:  Only take over-the-counter or prescription medicines as directed by your health care provider.  Do not take laxatives unless directed to do so by your health care provider.  Try a clear liquid diet (broth, tea, or water) as directed by your health care provider. Slowly move to a bland diet as tolerated. SEEK MEDICAL CARE IF:  You have unexplained abdominal pain.  You have abdominal pain associated with nausea or diarrhea.  You have pain when you urinate or have a bowel movement.  You experience abdominal pain that wakes you in the night.  You have abdominal pain that is worsened or improved by eating food.  You have abdominal pain that is worsened with eating fatty foods.  You have a fever. SEEK IMMEDIATE MEDICAL CARE IF:  Your pain does not go away within 2 hours.  You keep throwing up (vomiting).  Your pain is  felt only in portions of the abdomen, such as the right side or the left lower portion of the abdomen.  You pass bloody or black tarry stools. MAKE SURE YOU:  Understand these instructions.  Will watch your condition.  Will get help right away if you are not doing well or get worse.   This information is not intended to replace advice given to you by your health care provider. Make sure you discuss any questions you have with your health care provider.   Document Released: 06/21/2005 Document Revised: 06/02/2015 Document Reviewed: 05/21/2013 Elsevier Interactive Patient Education [employer]2016 Elsevier Inc.

## 2016-01-29 NOTE — ED Notes (Signed)
Pt sitting on chair texting on phone.

## 2016-01-29 NOTE — ED Notes (Signed)
Ambulates erect. abd soft, reports pain as both intermittent and constant at different times.

## 2016-02-06 ENCOUNTER — Emergency Department (HOSPITAL_COMMUNITY)
Admission: EM | Admit: 2016-02-06 | Discharge: 2016-02-07 | Disposition: A | Discharge status: Home / Self Care | Payer: Self-pay | Attending: Emergency Medicine | Admitting: Emergency Medicine

## 2016-02-06 ENCOUNTER — Encounter (HOSPITAL_COMMUNITY): Payer: Self-pay | Admitting: Emergency Medicine

## 2016-02-06 ENCOUNTER — Emergency Department (HOSPITAL_COMMUNITY): Payer: Self-pay

## 2016-02-06 DIAGNOSIS — S62339A Displaced fracture of neck of unspecified metacarpal bone, initial encounter for closed fracture: Secondary | ICD-10-CM

## 2016-02-06 DIAGNOSIS — F17290 Nicotine dependence, other tobacco product, uncomplicated: Secondary | ICD-10-CM | POA: Insufficient documentation

## 2016-02-06 DIAGNOSIS — Y999 Unspecified external cause status: Secondary | ICD-10-CM | POA: Insufficient documentation

## 2016-02-06 DIAGNOSIS — Y9389 Activity, other specified: Secondary | ICD-10-CM | POA: Insufficient documentation

## 2016-02-06 DIAGNOSIS — W2201XA Walked into wall, initial encounter: Secondary | ICD-10-CM | POA: Insufficient documentation

## 2016-02-06 DIAGNOSIS — F17210 Nicotine dependence, cigarettes, uncomplicated: Secondary | ICD-10-CM | POA: Insufficient documentation

## 2016-02-06 DIAGNOSIS — S62396A Other fracture of fifth metacarpal bone, right hand, initial encounter for closed fracture: Secondary | ICD-10-CM | POA: Insufficient documentation

## 2016-02-06 DIAGNOSIS — J45909 Unspecified asthma, uncomplicated: Secondary | ICD-10-CM | POA: Insufficient documentation

## 2016-02-06 DIAGNOSIS — Y929 Unspecified place or not applicable: Secondary | ICD-10-CM | POA: Insufficient documentation

## 2016-02-06 MED ORDER — TRAMADOL HCL 50 MG PO TABS: 50.0000 mg | ORAL_TABLET | Freq: Four times a day (QID) | ORAL | Status: DC | PRN

## 2016-02-06 MED ORDER — DICLOFENAC SODIUM 50 MG PO TBEC: 50.0000 mg | DELAYED_RELEASE_TABLET | Freq: Two times a day (BID) | ORAL | Status: DC

## 2016-02-06 MED ORDER — OXYCODONE-ACETAMINOPHEN 5-325 MG PO TABS
1.0000 | ORAL_TABLET | Freq: Once | ORAL | Status: AC
  Administered 2016-02-06: 1 via ORAL
  Filled 2016-02-06: qty 1

## 2016-02-06 NOTE — Discharge Instructions (Signed)
Follow up with Dr. Romeo AppleHarrison or Dr. Janee Mornhompson. Call for an appointment.  Cast or Splint Care Casts and splints support injured limbs and keep bones from moving while they heal.  HOME CARE  Keep the cast or splint uncovered during the drying period.  A plaster cast can take 24 to 48 hours to dry.  A fiberglass cast will dry in less than 1 hour.  Do not rest the cast on anything harder than a pillow for 24 hours.  Do not put weight on your injured limb. Do not put pressure on the cast. Wait for your doctor's approval.  Keep the cast or splint dry.  Cover the cast or splint with a plastic bag during baths or wet weather.  If you have a cast over your chest and belly (trunk), take sponge baths until the cast is taken off.  If your cast gets wet, dry it with a towel or blow dryer. Use the cool setting on the blow dryer.  Keep your cast or splint clean. Wash a dirty cast with a damp cloth.  Do not put any objects under your cast or splint.  Do not scratch the skin under the cast with an object. If itching is a problem, use a blow dryer on a cool setting over the itchy area.  Do not trim or cut your cast.  Do not take out the padding from inside your cast.  Exercise your joints near the cast as told by your doctor.  Raise (elevate) your injured limb on 1 or 2 pillows for the first 1 to 3 days. GET HELP IF:  Your cast or splint cracks.  Your cast or splint is too tight or too loose.  You itch badly under the cast.  Your cast gets wet or has a soft spot.  You have a bad smell coming from the cast.  You get an object stuck under the cast.  Your skin around the cast becomes red or sore.  You have new or more pain after the cast is put on. GET HELP RIGHT AWAY IF:  You have fluid leaking through the cast.  You cannot move your fingers or toes.  Your fingers or toes turn blue or white or are cool, painful, or puffy (swollen).  You have tingling or lose feeling (numbness)  around the injured area.  You have bad pain or pressure under the cast.  You have trouble breathing or have shortness of breath.  You have chest pain.   This information is not intended to replace advice given to you by your health care provider. Make sure you discuss any questions you have with your health care provider.   Document Released: 01/11/2011 Document Revised: 05/14/2013 Document Reviewed: 03/20/2013 Elsevier Interactive Patient Education [employer]2016 Elsevier Inc.

## 2016-02-06 NOTE — ED Provider Notes (Signed)
CSN: [id number]     Arrival date & time 02/06/16  2105 History   First MD Initiated Contact with Patient 02/06/16 2124     Chief Complaint  Patient presents with  . Hand Injury     (Consider location/radiation/quality/duration/timing/severity/associated sxs/prior Treatment) Patient is a 20 y.o. male presenting with hand injury. The history is provided by the patient. No language interpreter was used.  Hand Injury Location:  Hand Time since incident:  2 days Injury: yes   Hand location:  R hand Pain details:    Quality:  Aching and sharp   Radiates to:  Does not radiate   Severity:  Severe   Onset quality:  Sudden   Timing:  Constant   Progression:  Worsening Handedness:  Right-handed Dislocation: no   Foreign body present:  No foreign bodies Prior injury to area:  No Relieved by:  Nothing Worsened by:  Nothing tried Ineffective treatments:  Ice Associated symptoms: swelling    Randy [name] is a 20 y.o. male who presents to the ED with right hand pain and swelling. Patient reports getting upset and hitting a wall with his fist. He applied ice which reduced the swelling very minimal.  Past Medical History  Diagnosis Date  . Eczema     back, legs  . History of asthma     as a child  . Mandible fracture (HCC) 08/31/2013  . Limited jaw range of motion since 08/31/2013    due to mandible fracture  . Asthma    Past Surgical History  Procedure Laterality Date  . Orif mandibular fracture N/A 08/31/2013    Procedure: OPEN REDUCTION INTERNAL FIXATION (ORIF) MANDIBULAR FRACTURE MMF SCREWS ;  Surgeon: Darletta Moll, MD;  Location: [facility];  Service: ENT;  Laterality: N/A;  . Mandibular hardware removal N/A 10/06/2013    Procedure: MMF REMOVAL;  Surgeon: Darletta Moll, MD;  Location: [facility];  Service: ENT;  Laterality: N/A;   Family History  Problem Relation Age of Onset  . Hypertension Mother    Social History  Substance Use Topics  . Smoking status: Current  Some Day Smoker    Types: Cigarettes  . Smokeless tobacco: Current User    Types: Snuff     Comment: inside smokers at home  . Alcohol Use: Yes     Comment: Occassionally    Review of Systems Negative except as stated in HPI   Allergies  Bactrim; Hydrocodone; and Penicillins  Home Medications   Prior to Admission medications   Medication Sig Start Date End Date Taking? Authorizing Provider  diclofenac (VOLTAREN) 50 MG EC tablet Take 1 tablet (50 mg total) by mouth 2 (two) times daily. 02/06/16   Tkeyah Burkman Orlene Och, NP  traMADol (ULTRAM) 50 MG tablet Take 1 tablet (50 mg total) by mouth every 6 (six) hours as needed. 02/06/16   Trusten Hume Orlene Och, NP   BP 159/89 mmHg  Pulse 78  Temp(Src) 98.7 F (37.1 C) (Oral)  Resp 16  Ht  (1.803 m)  Wt 80.287 kg  BMI 24.70 kg/m2  SpO2 100% Physical Exam  Constitutional: He is oriented to person, place, and time. He appears well-developed and well-nourished.  HENT:  Head: Normocephalic and atraumatic.  Eyes: EOM are normal.  Neck: Neck supple.  Cardiovascular: Normal rate.   Pulmonary/Chest: Effort normal.  Musculoskeletal:       Right hand: He exhibits tenderness, bony tenderness and swelling. He exhibits normal capillary refill. Decreased range of  motion: due to pain and swelling. Lacerations: abrasion. Normal sensation noted. Normal strength noted.  Radial pulse 2+, adequate circulation. Ecchymosis and swelling noted to the dorsum of the right hand.   Neurological: He is alert and oriented to person, place, and time. No cranial nerve deficit.  Skin: Skin is warm and dry.  Psychiatric: He has a normal mood and affect. His behavior is normal.  Nursing note and vitals reviewed.   ED Course  Procedures (including critical care time) Labs Review Labs Reviewed - No data to display  Imaging Review Dg Hand Complete Right  02/06/2016  CLINICAL DATA:  Right hand pain and swelling for 3 days. Patient punched a wall on Friday night. EXAM:  RIGHT HAND - COMPLETE 3+ VIEW COMPARISON:  None. FINDINGS: There is an acute nondisplaced fracture of the proximal aspect of the right fifth metacarpal bone. Fracture line may focally extend to the carpometacarpal joint. There is old appearing fracture deformity of the distal right fifth metacarpal bone. Dorsal soft tissue swelling along the right hand. IMPRESSION: Acute nondisplaced fracture of the proximal right fifth metacarpal bone with probable articular involvement. Electronically Signed   By: Burman NievesWilliam  Stevens M.D.   On: 02/06/2016 22:53   I have personally reviewed and evaluated these images as part of my medical decision-making. I discussed this case with Dr. Ranae PalmsYelverton.  MDM  20 y.o. male with right hand pain and swelling s/p hitting a wall with his fist 3 nights ago stable for d/c without focal neuro deficits. Ulnar gutter splint applied, ice, pain management and f/u with ortho. Discussed with the patient and all questioned fully answered. He will return here if any problems arise.   Final diagnoses:  Boxer's fracture, closed, initial encounter       [facility] Kristelle Cavallaro, NP 02/06/16 2329  Loren Raceravid Yelverton, MD 02/07/16 0005

## 2016-02-06 NOTE — ED Notes (Signed)
Patient states he hit a wall with his right hand on Friday and has had pain and swelling to right hand since injury.

## 2016-02-07 NOTE — ED Notes (Signed)
Pt given ice pack to take home

## 2016-02-09 ENCOUNTER — Ambulatory Visit (INDEPENDENT_AMBULATORY_CARE_PROVIDER_SITE_OTHER): Payer: Self-pay | Admitting: Orthopaedic Surgery

## 2016-02-09 ENCOUNTER — Encounter: Payer: Self-pay | Admitting: Orthopaedic Surgery

## 2016-02-09 VITALS — BP 120/79 | HR 72 | Temp 98.1°F | Ht 71.5 in | Wt 176.9 lb

## 2016-02-09 DIAGNOSIS — S62319A Displaced fracture of base of unspecified metacarpal bone, initial encounter for closed fracture: Secondary | ICD-10-CM

## 2016-02-09 MED ORDER — ACETAMINOPHEN-CODEINE #3 300-30 MG PO TABS: ORAL_TABLET | ORAL | Status: DC

## 2016-02-09 NOTE — Progress Notes (Signed)
Subjective: I broke my right hand Saturday night    Patient ID: Randy [name], male    DOB: 1996/04/26, 20 y.o.   MRN: [id number]  Hand Injury  The incident occurred 3 to 5 days ago. The injury mechanism was a direct blow. The pain is present in the right hand. The quality of the pain is described as aching. The pain does not radiate. The pain is at a severity of 4/10. The pain is moderate. The pain has been improving since the incident. Pertinent negatives include no chest pain. The symptoms are aggravated by movement. He has tried immobilization and ice for the symptoms. The treatment provided moderate relief.   He got mad this last Saturday night and hit a wall.  He hurt his right hand.  He went to the ER Sunday and a fracture of the base of the fifth metacarpal was noted.  I have reviewed the ER report, the x-rays and the report.  Report states: IMPRESSION: Acute nondisplaced fracture of the proximal right fifth metacarpal bone with probable articular involvement.  He has no other injury.  Review of Systems  HENT: Negative for congestion.   Respiratory: Negative for shortness of breath.   Cardiovascular: Negative for chest pain and leg swelling.  Endocrine: Negative for cold intolerance.  Musculoskeletal: Positive for arthralgias.  Allergic/Immunologic: Negative for environmental allergies.  All other systems reviewed and are negative.  Past Medical History  Diagnosis Date  . Eczema     back, legs  . History of asthma     as a child  . Mandible fracture (HCC) 08/31/2013  . Limited jaw range of motion since 08/31/2013    due to mandible fracture  . Asthma     Past Surgical History  Procedure Laterality Date  . Orif mandibular fracture N/A 08/31/2013    Procedure: OPEN REDUCTION INTERNAL FIXATION (ORIF) MANDIBULAR FRACTURE MMF SCREWS ;  Surgeon: Darletta MollSui W Teoh, MD;  [facility];  Service: ENT;  Laterality: N/A;  . Mandibular hardware removal N/A 10/06/2013    Procedure:  MMF REMOVAL;  Surgeon: Darletta MollSui W Teoh, MD;  Location: [facility];  Service: ENT;  Laterality: N/A;    Current Outpatient Prescriptions on File Prior to Visit  Medication Sig Dispense Refill  . diclofenac (VOLTAREN) 50 MG EC tablet Take 1 tablet (50 mg total) by mouth 2 (two) times daily. (Patient not taking: Reported on 02/09/2016) 15 tablet 0   No current facility-administered medications on file prior to visit.    Social History   Social History  . Marital Status: Single    Spouse Name: N/A  . Number of Children: N/A  . Years of Education: N/A   Occupational History  . Not on file.   Social History Main Topics  . Smoking status: Current Some Day Smoker    Types: Cigarettes  . Smokeless tobacco: Current User    Types: Snuff     Comment: inside smokers at home  . Alcohol Use: Yes     Comment: Occassionally  . Drug Use: No  . Sexual Activity: Yes    Birth Control/ Protection: Condom   Other Topics Concern  . Not on file   Social History Narrative    BP 120/79 mmHg  Pulse 72  Temp(Src) 98.1 F (36.7 C)  Ht 5' 11.5" (1.816 m)  Wt 176 lb 14.4 oz (80.241 kg)  BMI 24.33 kg/m2     Objective:   Physical Exam  Constitutional: He is  oriented to person, place, and time. He appears well-developed and well-nourished.  HENT:  Head: Normocephalic and atraumatic.  Eyes: Conjunctivae and EOM are normal. Pupils are equal, round, and reactive to light.  Neck: Normal range of motion. Neck supple.  Cardiovascular: Normal rate, regular rhythm and intact distal pulses.   Pulmonary/Chest: Effort normal.  Abdominal: Soft.  Musculoskeletal: He exhibits tenderness (He is in a right hand/wrist gutter splint.  NV is intact.  He has no rotary changes of the right little finger.  Left hand normal.).  Neurological: He is alert and oriented to person, place, and time. He has normal reflexes. No cranial nerve deficit. He exhibits normal muscle tone. Coordination normal.  Skin:  Skin is warm and dry.  Psychiatric: He has a normal mood and affect. His behavior is normal. Judgment and thought content normal.    He was given Toradol in the ER.  He says it does not help.  He said he was allergic to hydrocodone but not codeine.  I gave Rx for Tylenol #3.  Before leaving he returned the Rx. He requested Percocet and I said no.  It is still a form of codeine.  I recommended Advil two to three pills every six hours.      Assessment & Plan:   Encounter Diagnosis  Name Primary?  . Fracture of metacarpal base of right hand, closed, initial encounter Yes    Return in one week.  X-rays out of the splint then.  New splint to be applied.  Precautions discussed.  Call if any problem.

## 2016-02-16 ENCOUNTER — Ambulatory Visit (INDEPENDENT_AMBULATORY_CARE_PROVIDER_SITE_OTHER): Payer: Self-pay

## 2016-02-16 ENCOUNTER — Encounter: Payer: Self-pay | Admitting: Orthopaedic Surgery

## 2016-02-16 ENCOUNTER — Ambulatory Visit: Payer: Self-pay | Admitting: Orthopaedic Surgery

## 2016-02-16 VITALS — BP 150/90 | HR 60 | Temp 98.1°F | Resp 16 | Ht 71.5 in | Wt 179.6 lb

## 2016-02-16 DIAGNOSIS — S62339D Displaced fracture of neck of unspecified metacarpal bone, subsequent encounter for fracture with routine healing: Secondary | ICD-10-CM

## 2016-02-16 DIAGNOSIS — S62306D Unspecified fracture of fifth metacarpal bone, right hand, subsequent encounter for fracture with routine healing: Secondary | ICD-10-CM

## 2016-02-16 MED ORDER — TRAMADOL HCL 50 MG PO TABS: 50.0000 mg | ORAL_TABLET | Freq: Four times a day (QID) | ORAL | Status: DC | PRN

## 2016-02-16 NOTE — Patient Instructions (Signed)
Wear brace  X-rays on return

## 2016-02-16 NOTE — Progress Notes (Signed)
CC:  My hand is sore  He has base of fifth metacarpal fracture on the right.  He has done well in the splint.  He has no new trauma.  NV is intact.  The splint was removed.  X-rays were done.  DX:  Fracture of base of fifth metacarpal of the right hand nondisplaced.  He was fitted with a [place] splint.  Instructions for use given.  Return to clinic with x-rays out of the splint in two weeks.  Call if any problem.

## 2016-03-01 ENCOUNTER — Ambulatory Visit (INDEPENDENT_AMBULATORY_CARE_PROVIDER_SITE_OTHER): Payer: Self-pay

## 2016-03-01 ENCOUNTER — Ambulatory Visit: Payer: Self-pay | Admitting: Orthopaedic Surgery

## 2016-03-01 ENCOUNTER — Encounter: Payer: Self-pay | Admitting: Orthopaedic Surgery

## 2016-03-01 VITALS — BP 125/74 | HR 78 | Temp 98.2°F | Ht 71.5 in | Wt 178.2 lb

## 2016-03-01 DIAGNOSIS — S62306D Unspecified fracture of fifth metacarpal bone, right hand, subsequent encounter for fracture with routine healing: Secondary | ICD-10-CM

## 2016-03-01 DIAGNOSIS — S62339D Displaced fracture of neck of unspecified metacarpal bone, subsequent encounter for fracture with routine healing: Secondary | ICD-10-CM

## 2016-03-01 NOTE — Progress Notes (Signed)
CC:  My hand is OK  He has no pain.  [facility]  He has no new trauma.  NV is intact.  He has full ROM of the fingers.  Encounter Diagnoses  Name Primary?  . Fracture of fifth metacarpal bone of right hand, with routine healing, subsequent encounter Yes  . Boxer's metacarpal fracture, neck, closed, with routine healing, subsequent encounter     Continue the [place] splint.  Return in two weeks.  X-rays then out of the splint.  Call if any problem.  Precautions given.  Electronically Signed Darreld McleanWayne Kamarie Palma, MD 6/7/201710:54 AM

## 2016-03-15 ENCOUNTER — Ambulatory Visit (INDEPENDENT_AMBULATORY_CARE_PROVIDER_SITE_OTHER): Payer: Self-pay

## 2016-03-15 ENCOUNTER — Encounter: Payer: Self-pay | Admitting: Orthopaedic Surgery

## 2016-03-15 ENCOUNTER — Ambulatory Visit: Payer: Self-pay | Admitting: Orthopaedic Surgery

## 2016-03-15 VITALS — BP 147/74 | HR 81 | Temp 98.1°F | Ht 72.0 in | Wt 175.8 lb

## 2016-03-15 DIAGNOSIS — S62306D Unspecified fracture of fifth metacarpal bone, right hand, subsequent encounter for fracture with routine healing: Secondary | ICD-10-CM

## 2016-03-15 NOTE — Patient Instructions (Signed)
May return to regular work

## 2016-03-15 NOTE — Progress Notes (Signed)
CC:  My hand is OK  He has no pain with the right hand.  He has full ROM.  NV intact.  X-rays done reported separately.  Encounter Diagnosis  Name Primary?  . Fracture of fifth metacarpal bone of right hand with routine healing Yes    He can stop the [place] splint.  He can return to work  Return here two weeks.  Precautions discussed.  Call if any problem.  Electronically Signed Darreld McleanWayne Inaara Tye, MD 6/21/20172:02 PM

## 2016-03-29 ENCOUNTER — Ambulatory Visit: Payer: Self-pay | Admitting: Orthopaedic Surgery

## 2016-08-10 ENCOUNTER — Encounter (HOSPITAL_COMMUNITY): Payer: Self-pay | Admitting: Emergency Medicine

## 2016-08-10 ENCOUNTER — Emergency Department (HOSPITAL_COMMUNITY): Payer: Self-pay

## 2016-08-10 ENCOUNTER — Emergency Department (HOSPITAL_COMMUNITY)
Admission: EM | Admit: 2016-08-10 | Discharge: 2016-08-10 | Disposition: A | Discharge status: Home / Self Care | Payer: Self-pay | Attending: Emergency Medicine | Admitting: Emergency Medicine

## 2016-08-10 DIAGNOSIS — F17290 Nicotine dependence, other tobacco product, uncomplicated: Secondary | ICD-10-CM | POA: Insufficient documentation

## 2016-08-10 DIAGNOSIS — J45909 Unspecified asthma, uncomplicated: Secondary | ICD-10-CM | POA: Insufficient documentation

## 2016-08-10 DIAGNOSIS — F17210 Nicotine dependence, cigarettes, uncomplicated: Secondary | ICD-10-CM | POA: Insufficient documentation

## 2016-08-10 DIAGNOSIS — B9789 Other viral agents as the cause of diseases classified elsewhere: Secondary | ICD-10-CM

## 2016-08-10 DIAGNOSIS — J069 Acute upper respiratory infection, unspecified: Secondary | ICD-10-CM | POA: Insufficient documentation

## 2016-08-10 DIAGNOSIS — Z79899 Other long term (current) drug therapy: Secondary | ICD-10-CM | POA: Insufficient documentation

## 2016-08-10 LAB — RAPID STREP SCREEN (MED CTR MEBANE ONLY): Streptococcus, Group A Screen (Direct): NEGATIVE

## 2016-08-10 NOTE — ED Provider Notes (Signed)
[facility]-EMERGENCY DEPT Provider Note   CSN: [id number] Arrival date & time: 08/10/16  1400     History   Chief Complaint Chief Complaint  Patient presents with  . Sore Throat    HPI Randy [name] is a 20 y.o. male.  HPI  20 y.o. male presents to the Emergency Department today complaining of sore throat x 1 week. Subjective fever. Cough noted with yellow sputum. No rhinorrhea. No congestion. No sick contacts. No pain currently. OTC Dayquil with minimal relief. Able to tolerate PO. No other symptoms noted.     Past Medical History:  Diagnosis Date  . Asthma   . Eczema    back, legs  . History of asthma    as a child  . Limited jaw range of motion since 08/31/2013   due to mandible fracture  . Mandible fracture (HCC) 08/31/2013    There are no active problems to display for this patient.   Past Surgical History:  Procedure Laterality Date  . MANDIBULAR HARDWARE REMOVAL N/A 10/06/2013   Procedure: MMF REMOVAL;  Surgeon: Darletta MollSui W Teoh, MD;  Location: [facility];  Service: ENT;  Laterality: N/A;  . ORIF MANDIBULAR FRACTURE N/A 08/31/2013   Procedure: OPEN REDUCTION INTERNAL FIXATION (ORIF) MANDIBULAR FRACTURE MMF SCREWS ;  Surgeon: Darletta MollSui W Teoh, MD;  [facility];  Service: ENT;  Laterality: N/A;       Home Medications    Prior to Admission medications   Medication Sig Start Date End Date Taking? Authorizing Provider  diclofenac (VOLTAREN) 50 MG EC tablet Take 1 tablet (50 mg total) by mouth 2 (two) times daily. 02/06/16   Hope Orlene OchM Neese, NP  traMADol (ULTRAM) 50 MG tablet Take 1 tablet (50 mg total) by mouth every 6 (six) hours as needed. 02/16/16   Darreld McleanWayne Keeling, MD    Family History Family History  Problem Relation Age of Onset  . Hypertension Mother     Social History Social History  Substance Use Topics  . Smoking status: Current Some Day Smoker    Types: Cigarettes  . Smokeless tobacco: Current User    Types: Snuff     Comment: inside  smokers at home  . Alcohol use Yes     Comment: Occassionally     Allergies   Bactrim; Hydrocodone; and Penicillins   Review of Systems Review of Systems  Constitutional: Positive for fever.  HENT: Positive for sore throat. Negative for congestion, ear pain and rhinorrhea.   Respiratory: Positive for cough.   Cardiovascular: Negative for chest pain.  Gastrointestinal: Negative for diarrhea, nausea and vomiting.   Physical Exam Updated Vital Signs BP 143/89 (BP Location: Left Arm)   Pulse 87   Temp 98.4 F (36.9 C) (Oral)   Resp 18   Ht 5' 11.5" (1.816 m)   Wt 84.1 kg   SpO2 99%   BMI 25.49 kg/m   Physical Exam  Constitutional: He is oriented to person, place, and time. He appears well-developed and well-nourished. No distress.  HENT:  Head: Normocephalic and atraumatic.  Right Ear: Tympanic membrane, external ear and ear canal normal.  Left Ear: Tympanic membrane, external ear and ear canal normal.  Nose: Nose normal.  Mouth/Throat: Uvula is midline, oropharynx is clear and moist and mucous membranes are normal. No trismus in the jaw. No oropharyngeal exudate, posterior oropharyngeal erythema or tonsillar abscesses.  Eyes: EOM are normal. Pupils are equal, round, and reactive to light.  Neck: Normal range of motion. Neck  supple. No tracheal deviation present.  Cardiovascular: Normal rate, regular rhythm, S1 normal, S2 normal, normal heart sounds, intact distal pulses and normal pulses.   Pulmonary/Chest: Effort normal and breath sounds normal. No respiratory distress. He has no decreased breath sounds. He has no wheezes. He has no rhonchi. He has no rales.  Abdominal: Normal appearance and bowel sounds are normal. There is no tenderness.  Musculoskeletal: Normal range of motion.  Neurological: He is alert and oriented to person, place, and time.  Skin: Skin is warm and dry.  Psychiatric: He has a normal mood and affect. His speech is normal and behavior is normal.  Thought content normal.   ED Treatments / Results  Labs (all labs ordered are listed, but only abnormal results are displayed) Labs Reviewed  RAPID STREP SCREEN (NOT AT [facility])  CULTURE, GROUP A STREP [facility])    EKG  EKG Interpretation None       Radiology Dg Chest 2 View  Result Date: 08/10/2016 CLINICAL DATA:  Productive cough and congestion for 1 week. EXAM: CHEST  2 VIEW COMPARISON:  05/20/2014 FINDINGS: The cardiomediastinal silhouette is within normal limits. The lungs are well inflated and clear. There is no evidence of pleural effusion or pneumothorax. No acute osseous abnormality is identified. IMPRESSION: No active cardiopulmonary disease. Electronically Signed   By: Sebastian AcheAllen  Grady M.D.   On: 08/10/2016 14:49    Procedures Procedures (including critical care time)  Medications Ordered in ED Medications - No data to display   Initial Impression / Assessment and Plan / ED Course  I have reviewed the triage vital signs and the nursing notes.  Pertinent labs & imaging results that were available during my care of the patient were reviewed by me and considered in my medical decision making (see chart for details).  Clinical Course     Final Clinical Impressions(s) / ED Diagnoses  I have reviewed and evaluated the relevant laboratory values I have reviewed the relevant previous healthcare records.I obtained HPI from historian.  ED Course:  Assessment: Pt is a 20yM presents with sore throat, cough x 1 week. Subjective fever. On exam, pt in NAD. VSS. Afebrile. Lungs CTA, Heart RRR. Rapid strep negative. CXR negative for acute pathology. Patients symptoms are consistent with URI, likely viral etiology. Discussed that antibiotics are not indicated for viral infections. Pt will be discharged with symptomatic treatment.  Verbalizes understanding and is agreeable with plan. Pt is hemodynamically stable & in NAD prior to dc.  Disposition/Plan:  DC Home Additional Verbal  discharge instructions given and discussed with patient.  Pt Instructed to f/u with PCP in the next week for evaluation and treatment of symptoms. Return precautions given Pt acknowledges and agrees with plan  Supervising Physician Samuel JesterKathleen McManus, DO   Final diagnoses:  Viral URI with cough    New Prescriptions New Prescriptions   No medications on file     Audry Piliyler Axelle Szwed, PA-C 08/10/16 1520    Samuel JesterKathleen McManus, DO 08/12/16 2038

## 2016-08-10 NOTE — ED Notes (Signed)
Patient given discharge instruction, verbalized understand. Patient ambulatory out of the department.  
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
## 2016-08-10 NOTE — ED Triage Notes (Signed)
Pt reports sore throat for a week with low grade fever and cough.

## 2016-08-12 LAB — CULTURE, GROUP A STREP (THRC)

## 2016-09-24 ENCOUNTER — Emergency Department (HOSPITAL_COMMUNITY)
Admission: EM | Admit: 2016-09-24 | Discharge: 2016-09-24 | Disposition: A | Discharge status: Home / Self Care | Payer: Self-pay | Attending: Emergency Medicine | Admitting: Emergency Medicine

## 2016-09-24 ENCOUNTER — Encounter (HOSPITAL_COMMUNITY): Payer: Self-pay

## 2016-09-24 ENCOUNTER — Emergency Department (HOSPITAL_COMMUNITY): Payer: Self-pay

## 2016-09-24 DIAGNOSIS — Y929 Unspecified place or not applicable: Secondary | ICD-10-CM | POA: Insufficient documentation

## 2016-09-24 DIAGNOSIS — Y9389 Activity, other specified: Secondary | ICD-10-CM | POA: Insufficient documentation

## 2016-09-24 DIAGNOSIS — S62345A Nondisplaced fracture of base of fourth metacarpal bone, left hand, initial encounter for closed fracture: Secondary | ICD-10-CM | POA: Insufficient documentation

## 2016-09-24 DIAGNOSIS — Y999 Unspecified external cause status: Secondary | ICD-10-CM | POA: Insufficient documentation

## 2016-09-24 DIAGNOSIS — J45909 Unspecified asthma, uncomplicated: Secondary | ICD-10-CM | POA: Insufficient documentation

## 2016-09-24 DIAGNOSIS — F17290 Nicotine dependence, other tobacco product, uncomplicated: Secondary | ICD-10-CM | POA: Insufficient documentation

## 2016-09-24 DIAGNOSIS — F17210 Nicotine dependence, cigarettes, uncomplicated: Secondary | ICD-10-CM | POA: Insufficient documentation

## 2016-09-24 MED ORDER — DICLOFENAC SODIUM 50 MG PO TBEC: 50.0000 mg | DELAYED_RELEASE_TABLET | Freq: Two times a day (BID) | ORAL | 0 refills | Status: AC

## 2016-09-24 MED ORDER — TRAMADOL HCL 50 MG PO TABS: 50.0000 mg | ORAL_TABLET | Freq: Four times a day (QID) | ORAL | 0 refills | Status: AC | PRN

## 2016-09-24 NOTE — Discharge Instructions (Signed)
I have given you the name of the hand [place] in [place] that is on call for us today. If you had rather see Dr. Romeo AppleHarrison as you indicated you can call to schedule an appointment.  Do not drive while taking the narcotic as it will make you sleepy.
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
## 2016-09-29 ENCOUNTER — Other Ambulatory Visit (HOSPITAL_COMMUNITY): Payer: Self-pay | Admitting: Orthopedic Surgery

## 2016-09-29 DIAGNOSIS — S62365A Nondisplaced fracture of neck of fourth metacarpal bone, left hand, initial encounter for closed fracture: Secondary | ICD-10-CM

## 2016-10-02 ENCOUNTER — Ambulatory Visit (HOSPITAL_COMMUNITY)
Admission: RE | Admit: 2016-10-02 | Discharge: 2016-10-02 | Disposition: A | Discharge status: Home / Self Care | Payer: Self-pay | Source: Ambulatory Visit | Attending: Orthopedic Surgery | Admitting: Orthopedic Surgery

## 2016-10-02 DIAGNOSIS — S62365A Nondisplaced fracture of neck of fourth metacarpal bone, left hand, initial encounter for closed fracture: Secondary | ICD-10-CM | POA: Insufficient documentation

## 2016-10-02 DIAGNOSIS — X58XXXA Exposure to other specified factors, initial encounter: Secondary | ICD-10-CM | POA: Insufficient documentation

## 2017-10-27 IMAGING — CT CT HAND*L* W/O CM
3 of 4 series · 11 of 33 positions shown, 13 images · non-contrast
Comparison: None.

CLINICAL DATA: Fourth metacarpal fracture of the left hand.

EXAM:
CT OF THE LEFT HAND WITHOUT CONTRAST
TECHNIQUE: Multidetector CT imaging of the left hand was performed according to
the standard protocol. Multiplanar CT image reconstructions were
also generated.

[Series 4: thin soft · axial · 0.33mm/px · z∈[-78,+50]mm · 3 of 671 slices shown, 4 images]
[im 122/671  soft-tissue]
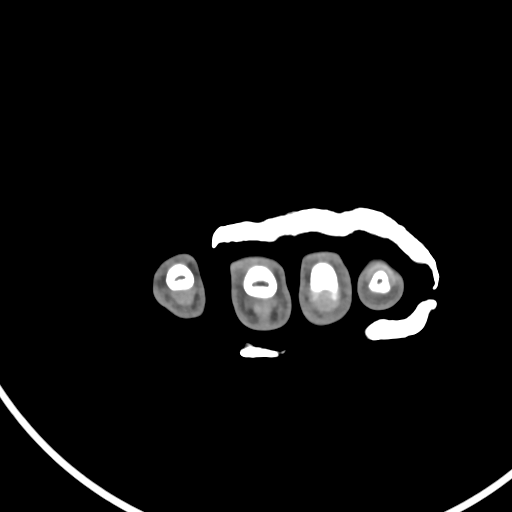
[im 122/671  bone]
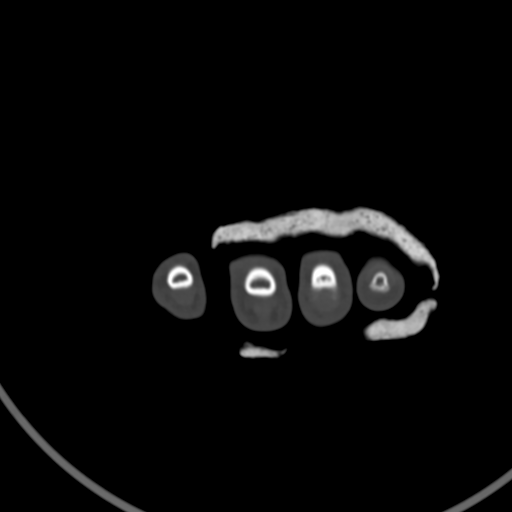
[im 366/671  bone]
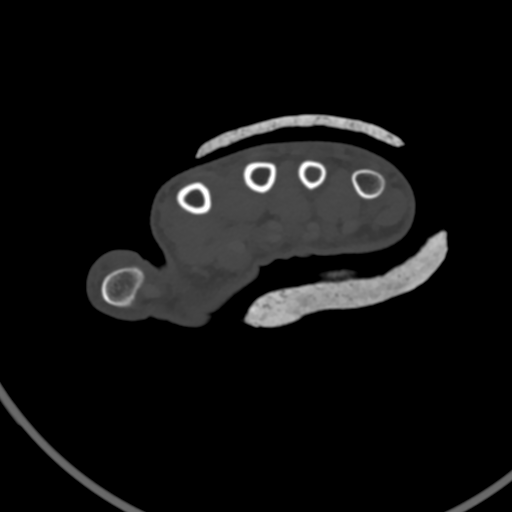
[im 549/671  bone]
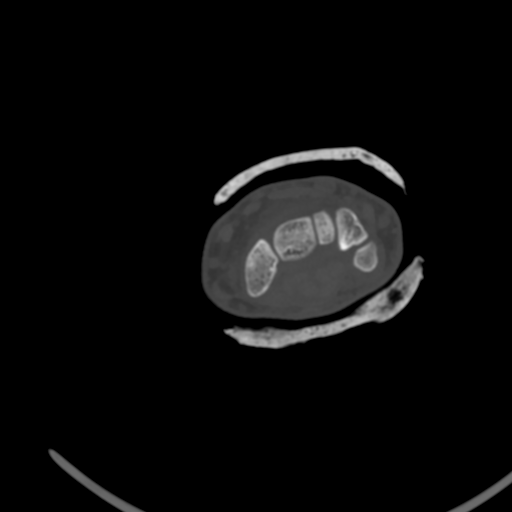

[Series 9: sag st · sagittal · 0.20mm/px · 5 of 95 slices shown, 6 images]
[im 32/95  bone]
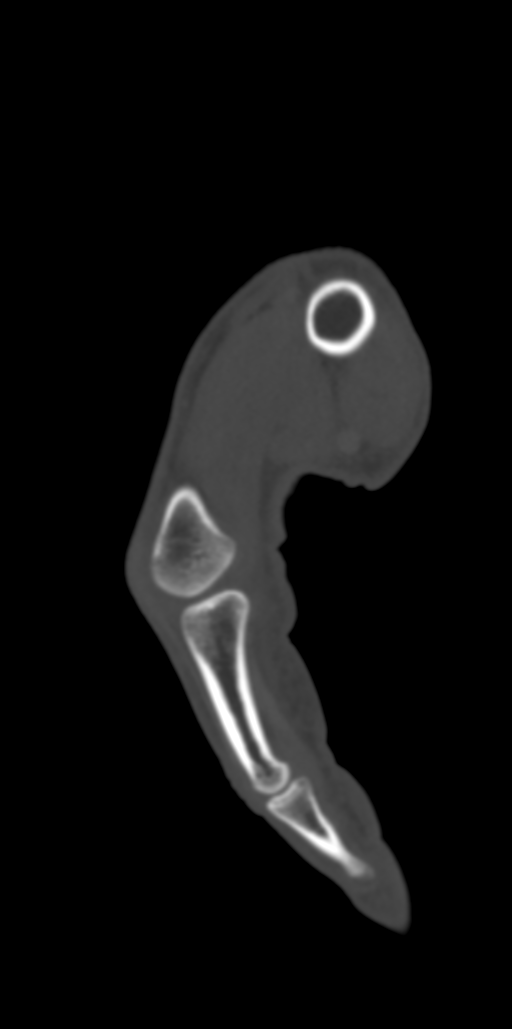
[im 40/95  bone]
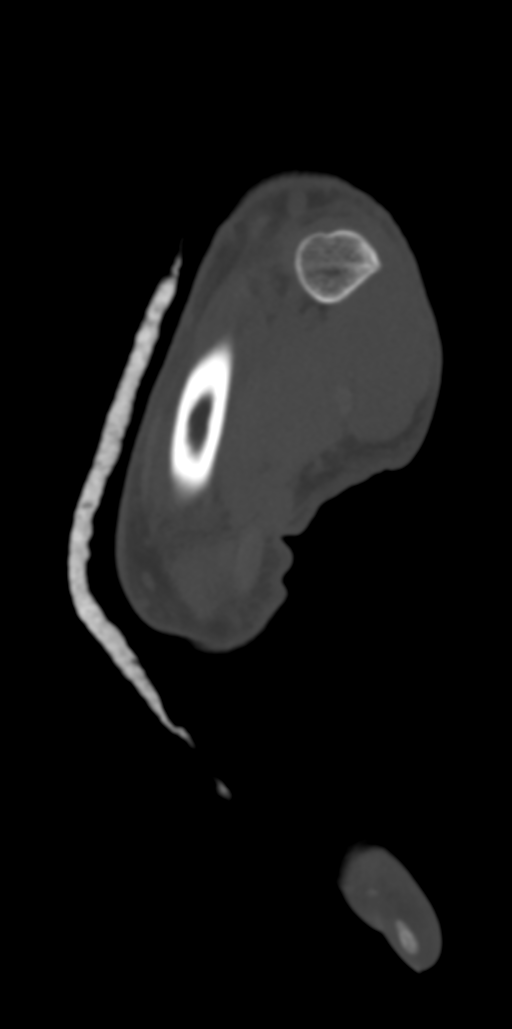
[im 48/95  soft-tissue]
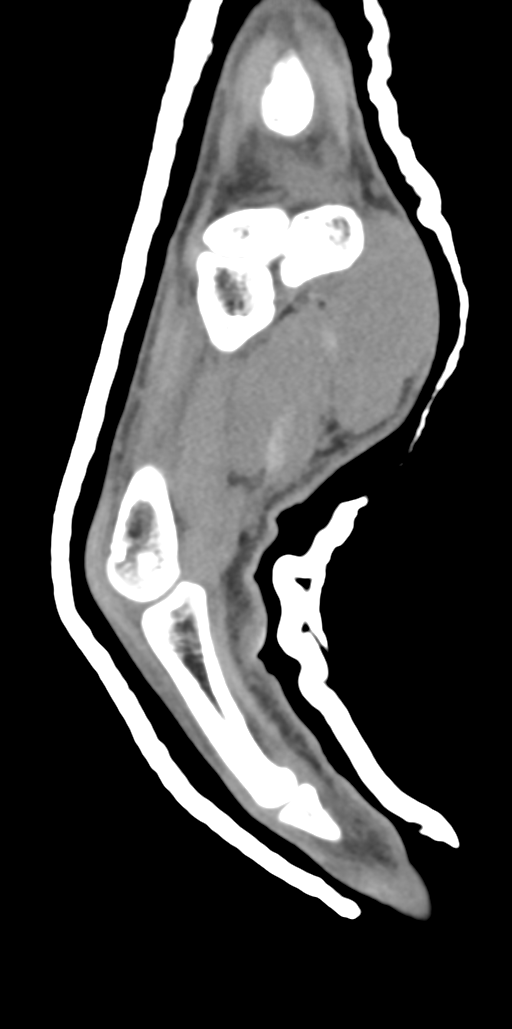
[im 48/95  bone]
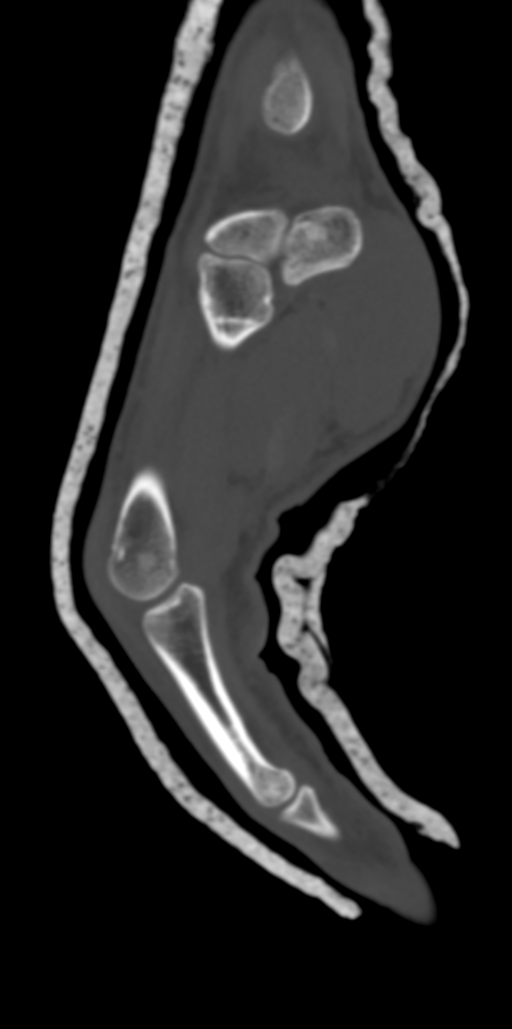
[im 55/95  bone]
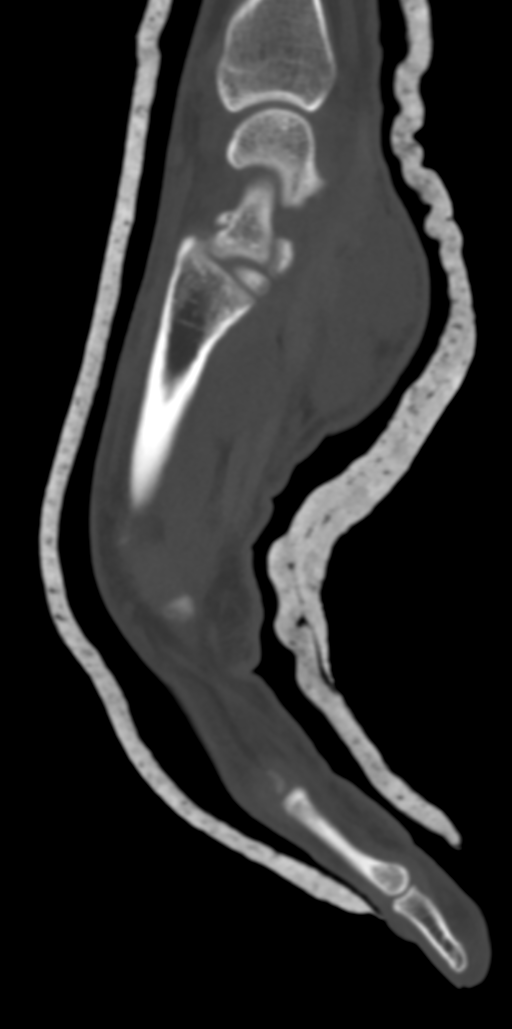
[im 63/95  bone]
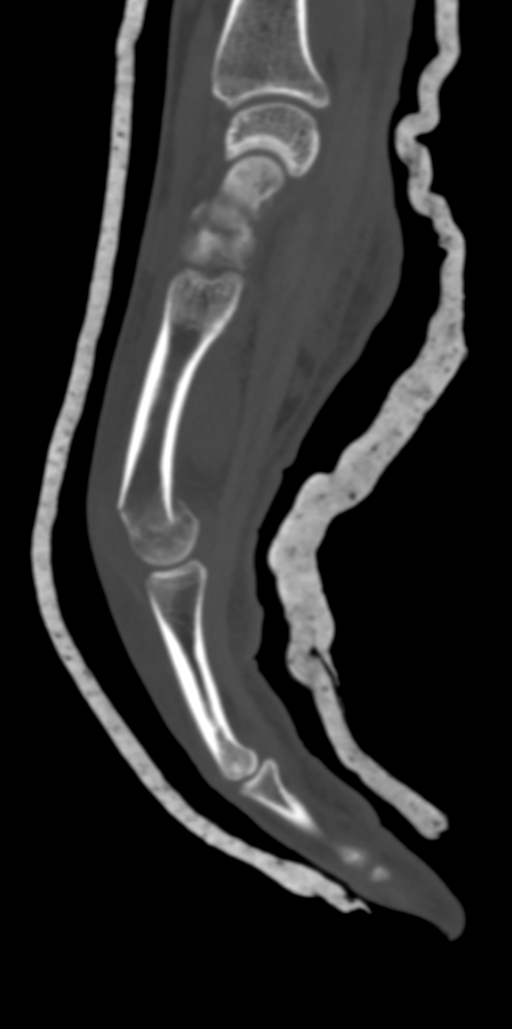

[Series 10: cor st · coronal · 0.29mm/px · 3 of 62 slices shown]
[im 13/62  bone]
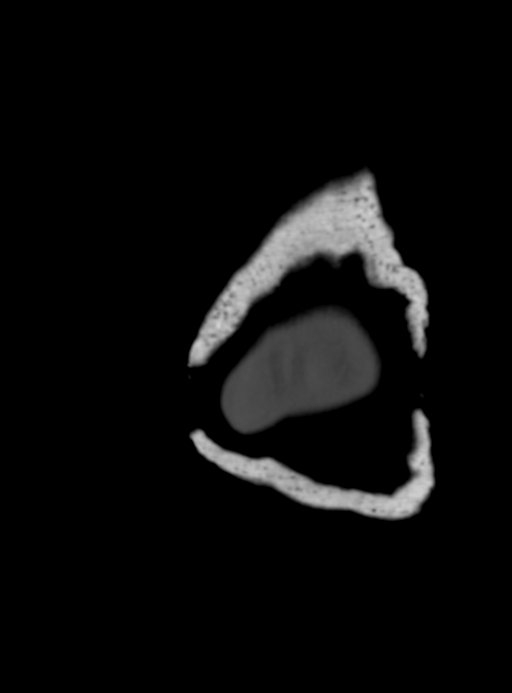
[im 25/62  bone]
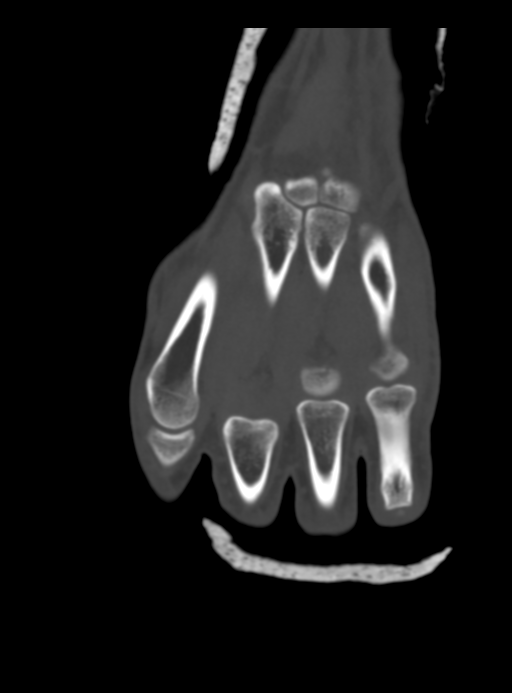
[im 37/62  bone]
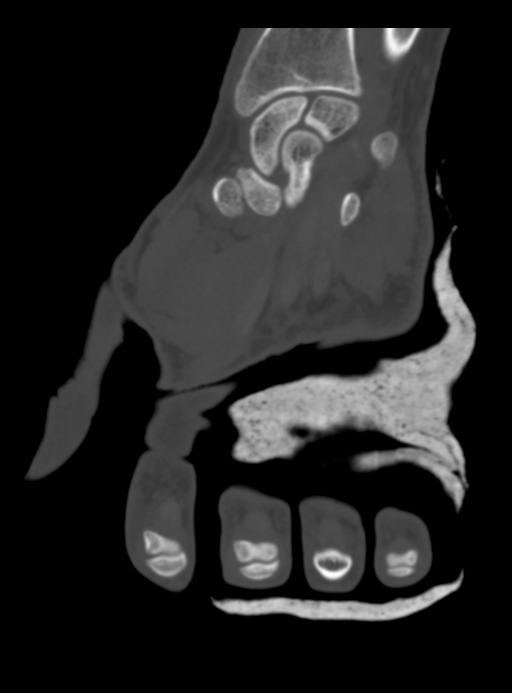

[11 of 33 positions shown; findings below may reference images not displayed]

FINDINGS: Bones/Joint/Cartilage

Nondisplaced, mildly comminuted fracture of the fourth metacarpal
head and neck with a vertical fracture cleft extending to the
posterior articular surface. Mild apex dorsal angulation.

No other fracture or dislocation. No periosteal reaction or bone
destruction. Joint spaces are maintained. No erosive changes.

Ulnar minus variance.  Otherwise normal alignment.

Ligaments

Suboptimally assessed by CT.

Muscles and Tendons

Muscles are normal. Flexor and extensor compartment tendons are
normal.

Soft tissues

No fluid collection or hematoma.
IMPRESSION: 1. Nondisplaced, mildly comminuted fracture of the fourth metacarpal
head and neck with a vertical fracture cleft extending to the
posterior articular surface and mild apex dorsal angulation.

## 2018-07-08 DIAGNOSIS — R42 Dizziness and giddiness: Secondary | ICD-10-CM | POA: Diagnosis not present

## 2018-07-24 DIAGNOSIS — H8110 Benign paroxysmal vertigo, unspecified ear: Secondary | ICD-10-CM | POA: Diagnosis not present

## 2018-07-24 DIAGNOSIS — Z6829 Body mass index (BMI) 29.0-29.9, adult: Secondary | ICD-10-CM | POA: Diagnosis not present

## 2023-02-25 DIAGNOSIS — L309 Dermatitis, unspecified: Secondary | ICD-10-CM | POA: Diagnosis not present

## 2023-08-12 DIAGNOSIS — J020 Streptococcal pharyngitis: Secondary | ICD-10-CM | POA: Diagnosis not present

## 2023-08-12 DIAGNOSIS — R051 Acute cough: Secondary | ICD-10-CM | POA: Diagnosis not present

## 2023-08-12 DIAGNOSIS — F17220 Nicotine dependence, chewing tobacco, uncomplicated: Secondary | ICD-10-CM | POA: Diagnosis not present

## 2023-08-12 DIAGNOSIS — Z20822 Contact with and (suspected) exposure to covid-19: Secondary | ICD-10-CM | POA: Diagnosis not present

## 2023-08-12 DIAGNOSIS — R059 Cough, unspecified: Secondary | ICD-10-CM | POA: Diagnosis not present

## 2023-08-12 DIAGNOSIS — Z720 Tobacco use: Secondary | ICD-10-CM | POA: Diagnosis not present

## 2023-08-12 DIAGNOSIS — R0602 Shortness of breath: Secondary | ICD-10-CM | POA: Diagnosis not present

## 2023-08-12 DIAGNOSIS — R058 Other specified cough: Secondary | ICD-10-CM | POA: Diagnosis not present

## 2023-08-12 DIAGNOSIS — Z1152 Encounter for screening for COVID-19: Secondary | ICD-10-CM | POA: Diagnosis not present

## 2023-09-22 DIAGNOSIS — H6061 Unspecified chronic otitis externa, right ear: Secondary | ICD-10-CM | POA: Diagnosis not present

## 2023-09-22 DIAGNOSIS — H6091 Unspecified otitis externa, right ear: Secondary | ICD-10-CM | POA: Diagnosis not present

## 2023-09-22 DIAGNOSIS — J45909 Unspecified asthma, uncomplicated: Secondary | ICD-10-CM | POA: Diagnosis not present

## 2023-09-22 DIAGNOSIS — H9201 Otalgia, right ear: Secondary | ICD-10-CM | POA: Diagnosis not present

## 2023-09-22 DIAGNOSIS — F17210 Nicotine dependence, cigarettes, uncomplicated: Secondary | ICD-10-CM | POA: Diagnosis not present

## 2023-09-22 DIAGNOSIS — Z7952 Long term (current) use of systemic steroids: Secondary | ICD-10-CM | POA: Diagnosis not present

## 2023-09-22 DIAGNOSIS — Z880 Allergy status to penicillin: Secondary | ICD-10-CM | POA: Diagnosis not present

## 2023-09-22 DIAGNOSIS — Z885 Allergy status to narcotic agent status: Secondary | ICD-10-CM | POA: Diagnosis not present

## 2023-10-30 ENCOUNTER — Ambulatory Visit
Admission: RE | Admit: 2023-10-30 | Discharge: 2023-10-30 | Disposition: A | Discharge status: Home / Self Care | Payer: BC Managed Care – PPO | Source: Ambulatory Visit | Attending: Family Medicine | Admitting: Family Medicine

## 2023-10-30 VITALS — BP 142/84 | HR 69 | Temp 98.7°F | Resp 18

## 2023-10-30 DIAGNOSIS — K130 Diseases of lips: Secondary | ICD-10-CM

## 2023-10-30 NOTE — Discharge Instructions (Signed)
You may try using over the counter clotrimazole cream twice daily for 1-2 weeks.

## 2023-10-30 NOTE — ED Triage Notes (Signed)
Pt reports he has a rash around his mouth x 3 days
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
## 2023-11-08 ENCOUNTER — Emergency Department (HOSPITAL_COMMUNITY): Payer: BC Managed Care – PPO

## 2023-11-08 ENCOUNTER — Encounter (HOSPITAL_COMMUNITY): Payer: Self-pay

## 2023-11-08 ENCOUNTER — Other Ambulatory Visit: Payer: Self-pay

## 2023-11-08 ENCOUNTER — Emergency Department (HOSPITAL_COMMUNITY)
Admission: EM | Admit: 2023-11-08 | Discharge: 2023-11-08 | Disposition: A | Discharge status: Home / Self Care | Payer: BC Managed Care – PPO | Attending: Emergency Medicine | Admitting: Emergency Medicine

## 2023-11-08 DIAGNOSIS — J101 Influenza due to other identified influenza virus with other respiratory manifestations: Secondary | ICD-10-CM | POA: Insufficient documentation

## 2023-11-08 DIAGNOSIS — R059 Cough, unspecified: Secondary | ICD-10-CM | POA: Diagnosis not present

## 2023-11-08 DIAGNOSIS — J45909 Unspecified asthma, uncomplicated: Secondary | ICD-10-CM | POA: Insufficient documentation

## 2023-11-08 DIAGNOSIS — R0789 Other chest pain: Secondary | ICD-10-CM | POA: Diagnosis not present

## 2023-11-08 LAB — RESP PANEL BY RT-PCR (RSV, FLU A&B, COVID)  RVPGX2
Influenza A by PCR: POSITIVE — AB
Influenza B by PCR: NEGATIVE
Resp Syncytial Virus by PCR: NEGATIVE
SARS Coronavirus 2 by RT PCR: NEGATIVE

## 2023-11-08 NOTE — Discharge Instructions (Signed)
Rest,  Drink plenty of fluids.  Take motrin or tylenol for achiness and fever reduction.    Get rechecked for increased shortness of breath,  Increased fever or increasing weakness.  You have tested positive for influenza A.  Your chest x-ray is negative for pneumonia.

## 2023-11-08 NOTE — ED Triage Notes (Signed)
Pt arrived via POV c/o persistent cough, chest pain, mild fever and Pt reports he has been wheezing for a few days.

## 2023-11-08 NOTE — ED Provider Notes (Signed)
 [place] EMERGENCY DEPARTMENT AT [facility] Provider Note   CSN: [id number] Arrival date & time: 11/08/23  1826     History  Chief Complaint  Patient presents with   Cough    Randy [name] is a 28 y.o. male with history including asthma only presenting for evaluation of a 2-day history of flulike symptoms including nonproductive cough, subjective fever, along with intermittent episodes of wheezing but no particular shortness of breath.  He has noted a few sharp fleeting stabs of pain in his right lateral chest which occurs randomly, not with coughing and not with exertion.  Not currently present.  He has had no nausea or vomiting, he does endorse generalized bodyaches and fatigue, no other complaint.    The history is provided by the patient.       Home Medications Prior to Admission medications   Medication Sig Start Date End Date Taking? Authorizing Provider  diclofenac (VOLTAREN) 50 MG EC tablet Take 1 tablet (50 mg total) by mouth 2 (two) times daily. 09/24/16   Janne Napoleon, NP  traMADol (ULTRAM) 50 MG tablet Take 1 tablet (50 mg total) by mouth every 6 (six) hours as needed. 09/24/16   Janne Napoleon, NP      Allergies    Bactrim, Hydrocodone, and Penicillins    Review of Systems   Review of Systems  Constitutional:  Positive for fever. Negative for chills.  HENT:  Positive for congestion. Negative for ear pain, rhinorrhea, sinus pressure, sore throat, trouble swallowing and voice change.   Eyes:  Negative for discharge.  Respiratory:  Positive for cough and wheezing. Negative for stridor.   Cardiovascular:  Positive for chest pain. Negative for palpitations and leg swelling.  Gastrointestinal:  Negative for abdominal pain.  Genitourinary: Negative.   Musculoskeletal:  Positive for myalgias.    Physical Exam Updated Vital Signs BP (!) 136/99 (BP Location: Right Arm)   Pulse 86   Temp 99.1 F (37.3 C)   Resp 16   Ht 5\' 11"  (1.803 m)   Wt 82  kg   SpO2 99%   BMI 25.21 kg/m  Physical Exam Vitals and nursing note reviewed.  Constitutional:      Appearance: He is well-developed.  HENT:     Head: Normocephalic and atraumatic.  Eyes:     Conjunctiva/sclera: Conjunctivae normal.  Cardiovascular:     Rate and Rhythm: Normal rate and regular rhythm.     Heart sounds: Normal heart sounds.  Pulmonary:     Effort: Pulmonary effort is normal.     Breath sounds: Normal breath sounds. No wheezing.  Musculoskeletal:        General: Normal range of motion.     Cervical back: Normal range of motion.  Skin:    General: Skin is warm and dry.  Neurological:     Mental Status: He is alert.     ED Results / Procedures / Treatments   Labs (all labs ordered are listed, but only abnormal results are displayed) Labs Reviewed  RESP PANEL BY RT-PCR (RSV, FLU A&B, COVID)  RVPGX2 - Abnormal; Notable for the following components:      Result Value   Influenza A by PCR POSITIVE (*)    All other components within normal limits    EKG None  Radiology DG Chest 2 View Result Date: 11/08/2023 CLINICAL DATA:  Cough, chest discomfort EXAM: CHEST - 2 VIEW COMPARISON:  08/10/2016 FINDINGS: The heart size and mediastinal contours are  within normal limits. Both lungs are clear. The visualized skeletal structures are unremarkable. IMPRESSION: No active cardiopulmonary disease. Electronically Signed   By: Minerva Fester M.D.   On: 11/08/2023 19:37    Procedures Procedures    Medications Ordered in ED Medications - No data to display  ED Course/ Medical Decision Making/ A&P                                 Medical Decision Making Patient presenting with a several day history of flulike symptoms.  He has no respiratory distress and exam is reassuring.  Discussed home treatment/care and return precautions.  Differential diagnosis including influenza, COVID, RSV, pneumonia, other viral URI.  Amount and/or Complexity of Data Reviewed Labs:  ordered.    Details: Respiratory panel is positive for influenza A Radiology: ordered.    Details: Negative for pneumonia           Final Clinical Impression(s) / ED Diagnoses Final diagnoses:  Influenza A    Rx / DC Orders ED Discharge Orders     None         Victoriano Lain 11/08/23 2108    Rondel Baton, MD 11/12/23 [phone]

## 2024-04-14 ENCOUNTER — Other Ambulatory Visit: Payer: Self-pay

## 2024-04-14 ENCOUNTER — Emergency Department (HOSPITAL_COMMUNITY)

## 2024-04-14 ENCOUNTER — Emergency Department (HOSPITAL_COMMUNITY)
Admission: EM | Admit: 2024-04-14 | Discharge: 2024-04-14 | Disposition: A | Discharge status: Home / Self Care | Attending: Emergency Medicine | Admitting: Emergency Medicine

## 2024-04-14 ENCOUNTER — Encounter (HOSPITAL_COMMUNITY): Payer: Self-pay | Admitting: Emergency Medicine

## 2024-04-14 DIAGNOSIS — R0981 Nasal congestion: Secondary | ICD-10-CM | POA: Insufficient documentation

## 2024-04-14 DIAGNOSIS — R059 Cough, unspecified: Secondary | ICD-10-CM | POA: Diagnosis not present

## 2024-04-14 DIAGNOSIS — R091 Pleurisy: Secondary | ICD-10-CM | POA: Diagnosis not present

## 2024-04-14 DIAGNOSIS — R200 Anesthesia of skin: Secondary | ICD-10-CM | POA: Insufficient documentation

## 2024-04-14 DIAGNOSIS — R0789 Other chest pain: Secondary | ICD-10-CM

## 2024-04-14 DIAGNOSIS — R29818 Other symptoms and signs involving the nervous system: Secondary | ICD-10-CM | POA: Diagnosis not present

## 2024-04-14 DIAGNOSIS — E876 Hypokalemia: Secondary | ICD-10-CM | POA: Diagnosis not present

## 2024-04-14 DIAGNOSIS — R079 Chest pain, unspecified: Secondary | ICD-10-CM | POA: Diagnosis not present

## 2024-04-14 LAB — HEPATIC FUNCTION PANEL
ALT: 14 U/L (ref 0–44)
AST: 14 U/L — ABNORMAL LOW (ref 15–41)
Albumin: 4.2 g/dL (ref 3.5–5.0)
Alkaline Phosphatase: 44 U/L (ref 38–126)
Bilirubin, Direct: 0.1 mg/dL (ref 0.0–0.2)
Indirect Bilirubin: 0.9 mg/dL (ref 0.3–0.9)
Total Bilirubin: 1 mg/dL (ref 0.0–1.2)
Total Protein: 7.4 g/dL (ref 6.5–8.1)

## 2024-04-14 LAB — TROPONIN I (HIGH SENSITIVITY)
Troponin I (High Sensitivity): 2 ng/L (ref <18)
Troponin I (High Sensitivity): 2 ng/L (ref <18)

## 2024-04-14 LAB — CBC
HCT: 44.6 % (ref 39.0–52.0)
Hemoglobin: 15.4 g/dL (ref 13.0–17.0)
MCH: 29.6 pg (ref 26.0–34.0)
MCHC: 34.5 g/dL (ref 30.0–36.0)
MCV: 85.8 fL (ref 80.0–100.0)
Platelets: 289 K/uL (ref 150–400)
RBC: 5.2 MIL/uL (ref 4.22–5.81)
RDW: 12.6 % (ref 11.5–15.5)
WBC: 7.4 K/uL (ref 4.0–10.5)
nRBC: 0 % (ref 0.0–0.2)

## 2024-04-14 LAB — D-DIMER, QUANTITATIVE: D-Dimer, Quant: 0.36 ug{FEU}/mL (ref 0.00–0.50)

## 2024-04-14 LAB — BASIC METABOLIC PANEL WITH GFR
Anion gap: 11 (ref 5–15)
BUN: 11 mg/dL (ref 6–20)
CO2: 25 mmol/L (ref 22–32)
Calcium: 9 mg/dL (ref 8.9–10.3)
Chloride: 100 mmol/L (ref 98–111)
Creatinine, Ser: 0.88 mg/dL (ref 0.61–1.24)
GFR, Estimated: >60 mL/min (ref >60)
Glucose, Bld: 99 mg/dL (ref 70–99)
Potassium: 3.4 mmol/L — ABNORMAL LOW (ref 3.5–5.1)
Sodium: 136 mmol/L (ref 135–145)

## 2024-04-14 MED ORDER — NAPROXEN 500 MG PO TABS: 500.0000 mg | ORAL_TABLET | Freq: Two times a day (BID) | ORAL | 0 refills | Status: AC

## 2024-04-14 MED ORDER — KETOROLAC TROMETHAMINE 15 MG/ML IJ SOLN
15.0000 mg | Freq: Once | INTRAMUSCULAR | Status: AC
  Administered 2024-04-14: 15 mg via INTRAVENOUS
  Filled 2024-04-14: qty 1

## 2024-04-14 NOTE — Discharge Instructions (Addendum)
 Please follow-up closely with a primary care doctor on an outpatient basis for continued workup and evaluation.  All of your workup in the emergency department has been unremarkable today.  Return to the emergency department immediately for any new or worsening symptoms.  [facility] Primary Care Doctor List    Rollene Pesa, MD. Specialty: [facility] Medicine Contact information: [address]  [place] [address]  [phone]   Glendia Fielding, MD. Specialty: [facility] Medicine Contact information: [address]  [place] [address]  [phone]   Benita Outhouse, MD Specialty: Internal Medicine Contact information: [address] [place] [address]  [phone]   Darlyn Hurst, MD. Specialty: Internal Medicine Contact information: [address]  [place] [address]  [phone]    [facility] (Dr. Luke) Specialty: Family Medicine Contact information: [address]  [place] [address]  [phone]   Garnette Lolling, MD. Specialty: [facility] Medicine Contact information: [address]  [address]  [place] [address]  [phone]   Gaither Langton, MD. Specialty: Internal Medicine Contact information: [address]  [address]  [place] [address]  [phone]   [facility]: [address] [phone]  [place], Family medicine [address]  [phone]  [facility] [address] [place], [place] [phone]  [facility]: [address] [phone]    [facility]  [address] [place], [place] [address] [phone]  Services [facility] offers a variety of basic health services.  Services include but are not limited to: Blood pressure checks  Heart rate checks  Blood sugar checks  Urine analysis  Rapid strep tests  Pregnancy tests.  Health education and referrals  People  needing more complex services will be directed to a physician online. Using these virtual visits, doctors can evaluate and prescribe medicine and treatments. There will be no medication on-site, though [facility] will help patients fill their prescriptions at little to no cost.   For More information please go to: [email or web address]  Allergy and Asthma:    [address] [place] [phone]  Urology:  [address]  [place] [phone]  [facility]  [address] [place], [place] [phone]  Orthopedics   [address] [place], [place] [phone]  Endocrinology  [address] [place], [place] [phone]  Podiatry: [facility] [phone]

## 2024-04-14 NOTE — ED Provider Notes (Signed)
 [place] EMERGENCY DEPARTMENT AT [facility] Provider Note   CSN: [id number] Arrival date & time: 04/14/24  1736     Patient presents with: Chest Pain   Randy [name] is a 28 y.o. male.   Patient is a 28 year old male who presents to the emergency department with chief complaint of left-sided chest pain which began earlier today while he was at work.  Patient notes that he did stop smoking approximately 3 weeks ago.  He notes that he has been smoking heavily since he was 28 years old.  Patient notes that since that time he has been experiencing a cough and congestion.  He notes that the cough has been productive in nature.  He denies any associated hemoptysis.  He has had no lower extremity pain or edema.  He denies any abdominal pain, nausea, vomiting, diarrhea.  He denies any personal history of cardiac or pulmonary disease.  He does admit to a family history of CAD.  He also notes that yesterday he developed numbness to the left side of his body which did last for short period of time and then resolved.  He denies any active numbness at this time.  There was no associated weakness.   Chest Pain Associated symptoms: numbness        Prior to Admission medications   Medication Sig Start Date End Date Taking? Authorizing Provider  diclofenac  (VOLTAREN ) 50 MG EC tablet Take 1 tablet (50 mg total) by mouth 2 (two) times daily. 09/24/16   Jamelle Lorrayne HERO, NP  traMADol  (ULTRAM ) 50 MG tablet Take 1 tablet (50 mg total) by mouth every 6 (six) hours as needed. 09/24/16   Jamelle Lorrayne HERO, NP    Allergies: Bactrim , Hydrocodone , and Penicillins    Review of Systems  Cardiovascular:  Positive for chest pain.  Neurological:  Positive for numbness.  All other systems reviewed and are negative.   Updated Vital Signs BP (!) 145/89   Pulse 65   Temp 99 F (37.2 C)   Resp 18   Ht 6' (1.829 m)   Wt 82 kg   SpO2 99%   BMI 24.52 kg/m   Physical Exam Vitals and nursing note  reviewed.  Constitutional:      Appearance: Normal appearance.  HENT:     Head: Normocephalic and atraumatic.     Nose: Nose normal.     Mouth/Throat:     Mouth: Mucous membranes are moist.  Eyes:     Extraocular Movements: Extraocular movements intact.     Conjunctiva/sclera: Conjunctivae normal.     Pupils: Pupils are equal, round, and reactive to light.  Cardiovascular:     Rate and Rhythm: Normal rate and regular rhythm.     Pulses: Normal pulses.     Heart sounds: Normal heart sounds. Heart sounds not distant. No murmur heard. Pulmonary:     Effort: Pulmonary effort is normal. No tachypnea or respiratory distress.     Breath sounds: Normal breath sounds. No stridor. No decreased breath sounds, wheezing, rhonchi or rales.  Chest:     Chest wall: No tenderness.  Abdominal:     General: Abdomen is flat. Bowel sounds are normal.     Palpations: Abdomen is soft. There is no mass.     Tenderness: There is no abdominal tenderness.  Musculoskeletal:        General: Normal range of motion.     Cervical back: Normal range of motion and neck supple.  Right lower leg: No edema.     Left lower leg: No edema.  Skin:    General: Skin is warm and dry.  Neurological:     General: No focal deficit present.     Mental Status: He is alert and oriented to person, place, and time. Mental status is at baseline.     Cranial Nerves: No cranial nerve deficit.     Motor: No weakness.     Comments: Strength out of 5 in bilateral upper and lower extremities, sensation intact in all 4 extremities  Psychiatric:        Mood and Affect: Mood normal.        Behavior: Behavior normal.        Thought Content: Thought content normal.        Judgment: Judgment normal.     (all labs ordered are listed, but only abnormal results are displayed) Labs Reviewed  BASIC METABOLIC PANEL WITH GFR - Abnormal; Notable for the following components:      Result Value   Potassium 3.4 (*)    All other  components within normal limits  HEPATIC FUNCTION PANEL - Abnormal; Notable for the following components:   AST 14 (*)    All other components within normal limits  CBC  D-DIMER, QUANTITATIVE  TROPONIN I (HIGH SENSITIVITY)  TROPONIN I (HIGH SENSITIVITY)    EKG: None  Radiology: DG Chest 2 View Result Date: 04/14/2024 CLINICAL DATA:  Chest pain EXAM: CHEST - 2 VIEW COMPARISON:  None Available. FINDINGS: Normal mediastinum and cardiac silhouette. Normal pulmonary vasculature. No evidence of effusion, infiltrate, or pneumothorax. No acute bony abnormality. IMPRESSION: No acute cardiopulmonary process. Electronically Signed   By: Jackquline Boxer M.D.   On: 04/14/2024 18:30     Procedures   Medications Ordered in the ED  ketorolac  (TORADOL ) 15 MG/ML injection 15 mg (15 mg Intravenous Given 04/14/24 2019)                                    Medical Decision Making Amount and/or Complexity of Data Reviewed Labs: ordered. Radiology: ordered.  Risk Prescription drug management.   This patient presents to the ED for concern of chest pain, cough, congestion, left-sided weakness differential diagnosis includes CVA, TIA, pericarditis, myocarditis, endocarditis, sepsis, pneumonia, pneumothorax, hemothorax, CVA, TIA, anxiety reaction, pleurisy, aortic aneurysm or dissection, pulmonary embolus    Additional history obtained:  Additional history obtained from none External records from outside source obtained and reviewed including none   Lab Tests:  I Ordered, and personally interpreted labs.  The pertinent results include: No leukocytosis, no anemia, normal kidney function liver function, mild hypokalemia, normal serial troponins, negative D-dimer   Imaging Studies ordered:  I ordered imaging studies including MRI brain, cervical spine, chest x-ray I independently visualized and interpreted imaging which showed no acute pathology noted to MRI brain and cervical spine, no acute  cardiopulmonary process I agree with the radiologist interpretation   Medicines ordered and prescription drug management:  I ordered medication including Toradol  for pleurisy Reevaluation of the patient after these medicines showed that the patient improved I have reviewed the patients home medicines and have made adjustments as needed   Problem List / ED Course:  Patient is doing well at this time and is stable for discharge home.  Discussed with patient that all workup in the emergency department has been unremarkable.  Do not suspect ACS  at this time.Patient had an EKG with no acute ischemic changes and negative serial troponins.  Blood work is otherwise been unremarkable as well.  D-dimer was negative and do not suspect pulmonary embolus as he is otherwise low risk via his PERC score.  Chest x-ray demonstrated no indication for pneumonia, pneumothorax, hemothorax.  Symptoms not positional in nature and do not suspect pericarditis or myocarditis.  He has no clinical indication for aortic aneurysm or dissection.  Suspect pleurisy as the cause of his chest pain at this time.  MRI brain and cervical spine was obtained given the patient's unexplained numbness to the left upper and lower extremity yesterday.  No acute changes were noted with this.  Did stressed the importance of continued close follow-up with her primary care doctor on an outpatient basis and patient notes that he is already working on this.  Strict turn precautions were provided as well for any new or worsening symptoms.  Patient voiced understanding and had no additional questions.   Social Determinants of Health:  None        Final diagnoses:  None    ED Discharge Orders     None          Randy [name] 04/14/24 2052    Francesca Elsie CROME, MD 04/14/24 2115

## 2024-04-14 NOTE — ED Triage Notes (Signed)
 Pt c/o left sided chest pain that started earlier today while at work. He states that he has been having a cough x 3-4 weeks. States yesterday his whole left side of his body went numb for 2 hours, has been normal since.

## 2024-05-20 DIAGNOSIS — L309 Dermatitis, unspecified: Secondary | ICD-10-CM | POA: Diagnosis not present

## 2024-05-20 DIAGNOSIS — Z7689 Persons encountering health services in other specified circumstances: Secondary | ICD-10-CM | POA: Diagnosis not present

## 2024-05-20 DIAGNOSIS — E669 Obesity, unspecified: Secondary | ICD-10-CM | POA: Diagnosis not present

## 2024-05-20 DIAGNOSIS — J45909 Unspecified asthma, uncomplicated: Secondary | ICD-10-CM | POA: Diagnosis not present

## 2024-05-20 DIAGNOSIS — F419 Anxiety disorder, unspecified: Secondary | ICD-10-CM | POA: Diagnosis not present

## 2024-05-30 DIAGNOSIS — J45909 Unspecified asthma, uncomplicated: Secondary | ICD-10-CM | POA: Diagnosis not present

## 2024-05-30 DIAGNOSIS — F419 Anxiety disorder, unspecified: Secondary | ICD-10-CM | POA: Diagnosis not present

## 2024-05-30 DIAGNOSIS — Z131 Encounter for screening for diabetes mellitus: Secondary | ICD-10-CM | POA: Diagnosis not present

## 2024-06-04 DIAGNOSIS — E669 Obesity, unspecified: Secondary | ICD-10-CM | POA: Diagnosis not present

## 2024-06-04 DIAGNOSIS — Z6829 Body mass index (BMI) 29.0-29.9, adult: Secondary | ICD-10-CM | POA: Diagnosis not present

## 2024-06-04 DIAGNOSIS — J45909 Unspecified asthma, uncomplicated: Secondary | ICD-10-CM | POA: Diagnosis not present

## 2024-06-04 DIAGNOSIS — L309 Dermatitis, unspecified: Secondary | ICD-10-CM | POA: Diagnosis not present

## 2024-06-04 DIAGNOSIS — Z0001 Encounter for general adult medical examination with abnormal findings: Secondary | ICD-10-CM | POA: Diagnosis not present

## 2024-06-04 DIAGNOSIS — F411 Generalized anxiety disorder: Secondary | ICD-10-CM | POA: Diagnosis not present

## 2024-07-23 DIAGNOSIS — J0110 Acute frontal sinusitis, unspecified: Secondary | ICD-10-CM | POA: Diagnosis not present

## 2024-07-23 DIAGNOSIS — F411 Generalized anxiety disorder: Secondary | ICD-10-CM | POA: Diagnosis not present

## 2024-07-23 DIAGNOSIS — F419 Anxiety disorder, unspecified: Secondary | ICD-10-CM | POA: Diagnosis not present

## 2024-07-23 DIAGNOSIS — E669 Obesity, unspecified: Secondary | ICD-10-CM | POA: Diagnosis not present

## 2024-08-28 ENCOUNTER — Other Ambulatory Visit (HOSPITAL_COMMUNITY): Payer: Self-pay

## 2024-08-28 DIAGNOSIS — M545 Low back pain, unspecified: Secondary | ICD-10-CM
